# Patient Record
Sex: Female | Born: 1965
Health system: Southern US, Community
[De-identification: ages and names within clinical notes are randomized; demographics above are authoritative.]

## PROBLEM LIST (undated history)

## (undated) DIAGNOSIS — Z789 Other specified health status: Secondary | ICD-10-CM

## (undated) DIAGNOSIS — R519 Headache, unspecified: Secondary | ICD-10-CM

## (undated) DIAGNOSIS — J45909 Unspecified asthma, uncomplicated: Secondary | ICD-10-CM

## (undated) DIAGNOSIS — C4491 Basal cell carcinoma of skin, unspecified: Secondary | ICD-10-CM

## (undated) DIAGNOSIS — R51 Headache: Secondary | ICD-10-CM

## (undated) HISTORY — PX: CHOLECYSTECTOMY: SHX55

## (undated) HISTORY — PX: COLONOSCOPY: SHX174

## (undated) HISTORY — DX: Basal cell carcinoma of skin, unspecified: C44.91

---

## 2007-08-10 ENCOUNTER — Ambulatory Visit: Payer: Self-pay | Admitting: Cardiology

## 2008-06-27 DIAGNOSIS — C4491 Basal cell carcinoma of skin, unspecified: Secondary | ICD-10-CM

## 2008-06-27 HISTORY — DX: Basal cell carcinoma of skin, unspecified: C44.91

## 2012-10-30 ENCOUNTER — Encounter (INDEPENDENT_AMBULATORY_CARE_PROVIDER_SITE_OTHER): Payer: Self-pay | Admitting: *Deleted

## 2012-10-30 ENCOUNTER — Encounter (INDEPENDENT_AMBULATORY_CARE_PROVIDER_SITE_OTHER): Payer: Self-pay

## 2012-11-21 ENCOUNTER — Telehealth (INDEPENDENT_AMBULATORY_CARE_PROVIDER_SITE_OTHER): Payer: Self-pay | Admitting: *Deleted

## 2012-11-21 ENCOUNTER — Other Ambulatory Visit (INDEPENDENT_AMBULATORY_CARE_PROVIDER_SITE_OTHER): Payer: Self-pay | Admitting: *Deleted

## 2012-11-21 DIAGNOSIS — Z1211 Encounter for screening for malignant neoplasm of colon: Secondary | ICD-10-CM

## 2012-11-21 DIAGNOSIS — Z800 Family history of malignant neoplasm of digestive organs: Secondary | ICD-10-CM

## 2012-11-21 MED ORDER — PEG-KCL-NACL-NASULF-NA ASC-C 100 G PO SOLR: 1.0000 | Freq: Once | ORAL | Status: DC

## 2012-11-21 NOTE — Telephone Encounter (Signed)
Patient needs movi prep 

## 2012-12-26 ENCOUNTER — Telehealth (INDEPENDENT_AMBULATORY_CARE_PROVIDER_SITE_OTHER): Payer: Self-pay | Admitting: *Deleted

## 2012-12-26 NOTE — Telephone Encounter (Signed)
  Procedure: tcs  Reason/Indication:  fam hx colon ca  Has patient had this procedure before?  Yes, 2009 (scanned)  If so, when, by whom and where?    Is there a family history of colon cancer?  Yes, father  Who?  What age when diagnosed?    Is patient diabetic?   no      Does patient have prosthetic heart valve?  no  Do you have a pacemaker?  no  Has patient ever had endocarditis? no  Has patient had joint replacement within last 12 months?  no  Is patient on Coumadin, Plavix and/or Aspirin? no  Medications: lavora (BC)  Allergies: nkda  Medication Adjustment:   Procedure date & time: 01/24/13 at 730

## 2012-12-27 NOTE — Telephone Encounter (Signed)
agree

## 2013-01-09 ENCOUNTER — Encounter (HOSPITAL_COMMUNITY): Payer: Self-pay | Admitting: Pharmacy Technician

## 2013-01-21 ENCOUNTER — Encounter (INDEPENDENT_AMBULATORY_CARE_PROVIDER_SITE_OTHER): Payer: Self-pay | Admitting: *Deleted

## 2013-01-24 ENCOUNTER — Encounter (HOSPITAL_COMMUNITY)
Admission: RE | Disposition: A | Discharge status: Home / Self Care | Payer: Self-pay | Source: Ambulatory Visit | Attending: Internal Medicine

## 2013-01-24 ENCOUNTER — Ambulatory Visit (HOSPITAL_COMMUNITY)
Admission: RE | Admit: 2013-01-24 | Discharge: 2013-01-24 | Disposition: A | Discharge status: Home / Self Care | Payer: BC Managed Care – PPO | Source: Ambulatory Visit | Attending: Internal Medicine | Admitting: Internal Medicine

## 2013-01-24 ENCOUNTER — Encounter (HOSPITAL_COMMUNITY): Payer: Self-pay | Admitting: *Deleted

## 2013-01-24 DIAGNOSIS — K5730 Diverticulosis of large intestine without perforation or abscess without bleeding: Secondary | ICD-10-CM

## 2013-01-24 DIAGNOSIS — Z1211 Encounter for screening for malignant neoplasm of colon: Secondary | ICD-10-CM | POA: Insufficient documentation

## 2013-01-24 DIAGNOSIS — Z800 Family history of malignant neoplasm of digestive organs: Secondary | ICD-10-CM | POA: Insufficient documentation

## 2013-01-24 HISTORY — DX: Other specified health status: Z78.9

## 2013-01-24 HISTORY — PX: COLONOSCOPY: SHX5424

## 2013-01-24 SURGERY — COLONOSCOPY
Anesthesia: Moderate Sedation

## 2013-01-24 MED ORDER — SODIUM CHLORIDE 0.9 % IV SOLN
INTRAVENOUS | Status: DC
Start: 2013-01-24 — End: 2013-01-24
  Administered 2013-01-24: 07:00:00 via INTRAVENOUS

## 2013-01-24 MED ORDER — MIDAZOLAM HCL 5 MG/5ML IJ SOLN
INTRAMUSCULAR | Status: DC | PRN
  Administered 2013-01-24 (×5): 2 mg via INTRAVENOUS

## 2013-01-24 MED ORDER — MEPERIDINE HCL 50 MG/ML IJ SOLN
INTRAMUSCULAR | Status: DC | PRN
  Administered 2013-01-24 (×2): 25 mg via INTRAVENOUS

## 2013-01-24 MED ORDER — STERILE WATER FOR IRRIGATION IR SOLN
Status: DC | PRN
  Administered 2013-01-24: 08:00:00

## 2013-01-24 MED ORDER — MEPERIDINE HCL 50 MG/ML IJ SOLN
INTRAMUSCULAR | Status: AC
  Filled 2013-01-24: qty 1

## 2013-01-24 MED ORDER — MIDAZOLAM HCL 5 MG/5ML IJ SOLN
INTRAMUSCULAR | Status: AC
  Filled 2013-01-24: qty 10

## 2013-01-24 NOTE — H&P (Signed)
Anna Hanson is an 47 y.o. female.   Chief Complaint: Patient is here for colonoscopy. HPI: Patient is 47 year old Caucasian female who is for high-risk screening colonoscopy. She denies abdominal pain change in bowel habits or rectal bleeding. Patient's last colonoscopy was in July 2009. Family history is significant for colon carcinoma in her father age 36 and he died of metastatic disease 10 years later.  Past Medical History  Diagnosis Date  . Medical history non-contributory     Past Surgical History  Procedure Laterality Date  . Cholecystectomy    . Colonoscopy      Family History  Problem Relation Age of Onset  . Colon cancer Father    Social History:  reports that she has never smoked. She does not have any smokeless tobacco history on file. She reports that she does not drink alcohol or use illicit drugs.  Allergies:  Allergies  Allergen Reactions  . Other Anaphylaxis    Cashews and Pecans    Medications Prior to Admission  Medication Sig Dispense Refill  . levonorgestrel-ethinyl estradiol (LEVORA 0.15/30, 28,) 0.15-30 MG-MCG tablet Take 1 tablet by mouth daily.      . peg 3350 powder (MOVIPREP) 100 G SOLR Take 1 kit (100 g total) by mouth once.  1 kit  0    No results found for this or any previous visit (from the past 48 hour(s)). No results found.  ROS  Blood pressure 151/97, temperature 98.1 F (36.7 C), temperature source Oral, resp. rate 19, height 5\' 5"  (1.651 m), weight 165 lb (74.844 kg), last menstrual period 01/17/2013, SpO2 98.00%. Physical Exam  Constitutional: She appears well-developed and well-nourished.  HENT:  Mouth/Throat: Oropharynx is clear and moist.  Eyes: Conjunctivae are normal. No scleral icterus.  Neck: No thyromegaly present.  Cardiovascular: Normal rate, regular rhythm and normal heart sounds.   No murmur heard. Respiratory: Effort normal and breath sounds normal.  GI: Soft. She exhibits no distension and no mass. There is no  tenderness.  Musculoskeletal: She exhibits no edema.  Lymphadenopathy:    She has no cervical adenopathy.  Neurological: She is alert.  Skin: Skin is warm and dry.     Assessment/Plan High risk screening colonoscopy. Family history of colon carcinoma first degree relative(father, age 12 at time of diagnosis).  [name] U 01/24/2013, 7:34 AM

## 2013-01-24 NOTE — Op Note (Signed)
COLONOSCOPY PROCEDURE REPORT  PATIENT:  Anna Hanson  MR#:  [id number] Birthdate:  [birth year], 47 y.o., female Endoscopist:  Dr. Malissa Hippo, MD Referred By:  Dr. Donzetta Sprung, MD  Procedure Date: 01/24/2013  Procedure:   Colonoscopy  Indications:  Patient is 47 year old Caucasian female who is undergoing high risk screening colonoscopy. Patient's last exam was in July 2009. Family history significant for colon carcinoma in the father at age 29. He died of metastases 10 year later.  Informed Consent:  The procedure and risks were reviewed with the patient and informed consent was obtained.  Medications:  Demerol 50 mg IV Versed 10 mg IV  Description of procedure:  After a digital rectal exam was performed, that colonoscope was advanced from the anus through the rectum and colon to the area of the cecum, ileocecal valve and appendiceal orifice. The cecum was deeply intubated. These structures were well-seen and photographed for the record. From the level of the cecum and ileocecal valve, the scope was slowly and cautiously withdrawn. The mucosal surfaces were carefully surveyed utilizing scope tip to flexion to facilitate fold flattening as needed. The scope was pulled down into the rectum where a thorough exam including retroflexion was performed.  Findings:   Prep excellent. Scattered diverticula at the sigmoid and descending colon. Normal rectal mucosa and anal rectal junction.   Therapeutic/Diagnostic Maneuvers Performed:   None  Complications:  None  Cecal Withdrawal Time:  8 minutes  Impression:  Few scattered diverticula at sigmoid and descending colon otherwise normal colonoscopy.  Recommendations:  Standard instructions given. Next screening exam in 5 years.  Tesha Archambeau U  01/24/2013 8:14 AM  CC: Dr. Donzetta Sprung, MD & Dr. Bonnetta Barry ref. provider found

## 2013-01-25 ENCOUNTER — Encounter (HOSPITAL_COMMUNITY): Payer: Self-pay | Admitting: Internal Medicine

## 2016-01-04 DIAGNOSIS — M5441 Lumbago with sciatica, right side: Secondary | ICD-10-CM | POA: Diagnosis not present

## 2016-01-04 DIAGNOSIS — S338XXA Sprain of other parts of lumbar spine and pelvis, initial encounter: Secondary | ICD-10-CM | POA: Diagnosis not present

## 2016-01-07 DIAGNOSIS — M5441 Lumbago with sciatica, right side: Secondary | ICD-10-CM | POA: Diagnosis not present

## 2016-01-07 DIAGNOSIS — S338XXA Sprain of other parts of lumbar spine and pelvis, initial encounter: Secondary | ICD-10-CM | POA: Diagnosis not present

## 2016-01-12 DIAGNOSIS — S338XXA Sprain of other parts of lumbar spine and pelvis, initial encounter: Secondary | ICD-10-CM | POA: Diagnosis not present

## 2016-01-12 DIAGNOSIS — M5441 Lumbago with sciatica, right side: Secondary | ICD-10-CM | POA: Diagnosis not present

## 2016-01-15 DIAGNOSIS — S338XXA Sprain of other parts of lumbar spine and pelvis, initial encounter: Secondary | ICD-10-CM | POA: Diagnosis not present

## 2016-01-15 DIAGNOSIS — Z6833 Body mass index (BMI) 33.0-33.9, adult: Secondary | ICD-10-CM | POA: Diagnosis not present

## 2016-01-15 DIAGNOSIS — M545 Low back pain: Secondary | ICD-10-CM | POA: Diagnosis not present

## 2016-01-15 DIAGNOSIS — M5441 Lumbago with sciatica, right side: Secondary | ICD-10-CM | POA: Diagnosis not present

## 2016-01-15 DIAGNOSIS — Z9189 Other specified personal risk factors, not elsewhere classified: Secondary | ICD-10-CM | POA: Diagnosis not present

## 2016-01-15 DIAGNOSIS — Z1389 Encounter for screening for other disorder: Secondary | ICD-10-CM | POA: Diagnosis not present

## 2016-01-15 DIAGNOSIS — Z23 Encounter for immunization: Secondary | ICD-10-CM | POA: Diagnosis not present

## 2016-01-18 DIAGNOSIS — M5441 Lumbago with sciatica, right side: Secondary | ICD-10-CM | POA: Diagnosis not present

## 2016-01-18 DIAGNOSIS — S338XXA Sprain of other parts of lumbar spine and pelvis, initial encounter: Secondary | ICD-10-CM | POA: Diagnosis not present

## 2016-01-22 DIAGNOSIS — M545 Low back pain: Secondary | ICD-10-CM | POA: Diagnosis not present

## 2016-01-28 DIAGNOSIS — M545 Low back pain: Secondary | ICD-10-CM | POA: Diagnosis not present

## 2016-01-28 DIAGNOSIS — M5127 Other intervertebral disc displacement, lumbosacral region: Secondary | ICD-10-CM | POA: Diagnosis not present

## 2016-02-02 DIAGNOSIS — M5126 Other intervertebral disc displacement, lumbar region: Secondary | ICD-10-CM | POA: Diagnosis not present

## 2016-02-03 ENCOUNTER — Other Ambulatory Visit: Payer: Self-pay | Admitting: Neurosurgery

## 2016-02-03 DIAGNOSIS — M5126 Other intervertebral disc displacement, lumbar region: Secondary | ICD-10-CM

## 2016-02-11 ENCOUNTER — Other Ambulatory Visit: Payer: Self-pay

## 2016-02-15 ENCOUNTER — Ambulatory Visit
Admission: RE | Admit: 2016-02-15 | Discharge: 2016-02-15 | Disposition: A | Discharge status: Home / Self Care | Payer: BLUE CROSS/BLUE SHIELD | Source: Ambulatory Visit | Attending: Neurosurgery | Admitting: Neurosurgery

## 2016-02-15 ENCOUNTER — Other Ambulatory Visit: Payer: Self-pay | Admitting: Neurosurgery

## 2016-02-15 DIAGNOSIS — M5126 Other intervertebral disc displacement, lumbar region: Secondary | ICD-10-CM

## 2016-02-15 DIAGNOSIS — M5127 Other intervertebral disc displacement, lumbosacral region: Secondary | ICD-10-CM | POA: Diagnosis not present

## 2016-02-15 IMAGING — XA DG EPIDURAL/NERVE ROOT
2 series · 2 of 2 positions shown · non-contrast
Comparison: none

CLINICAL DATA: RIGHT leg pain.  L5-S1 disc extrusion.

[Series 1: ortho standard · 1 of 1 slices shown (1 of 2)]
[im 1/1]
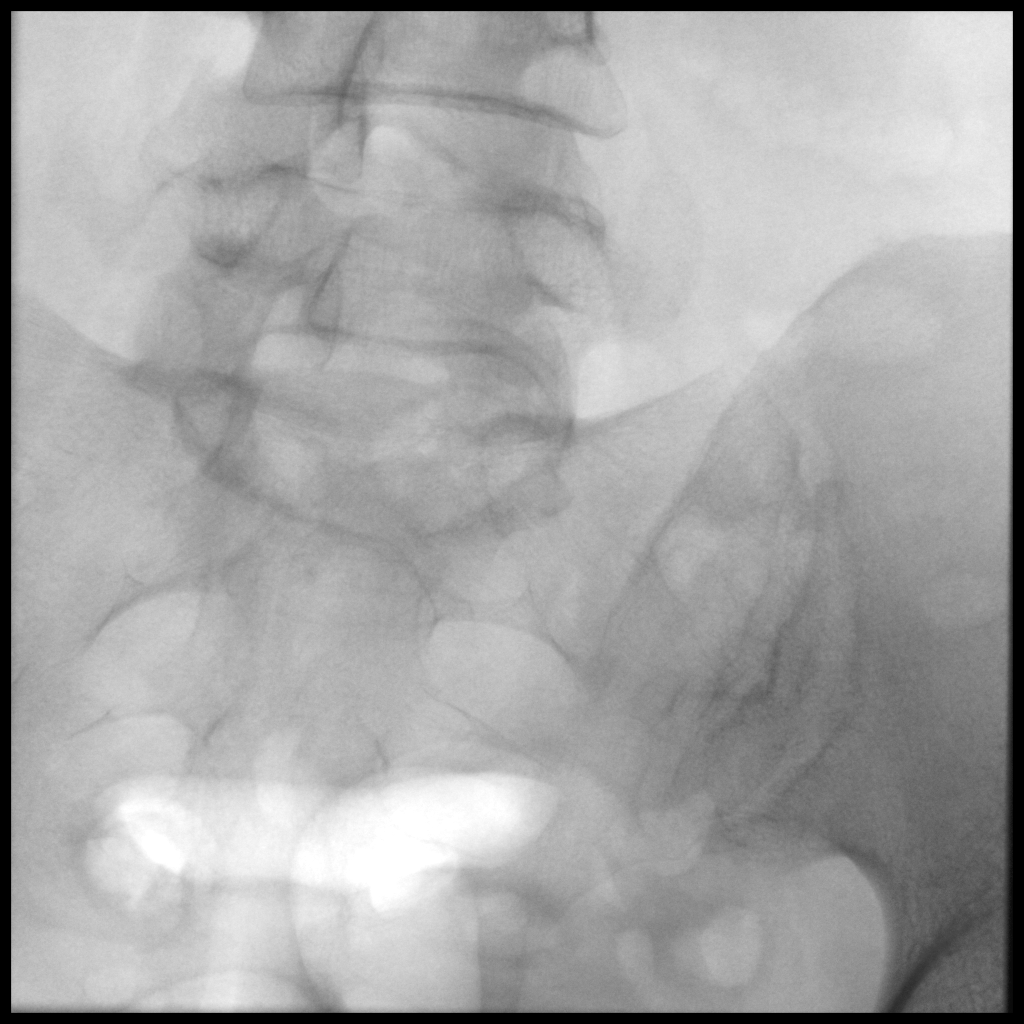

[Series 2: ortho standard · 1 of 1 slices shown (2 of 2)]
[im 1/1]
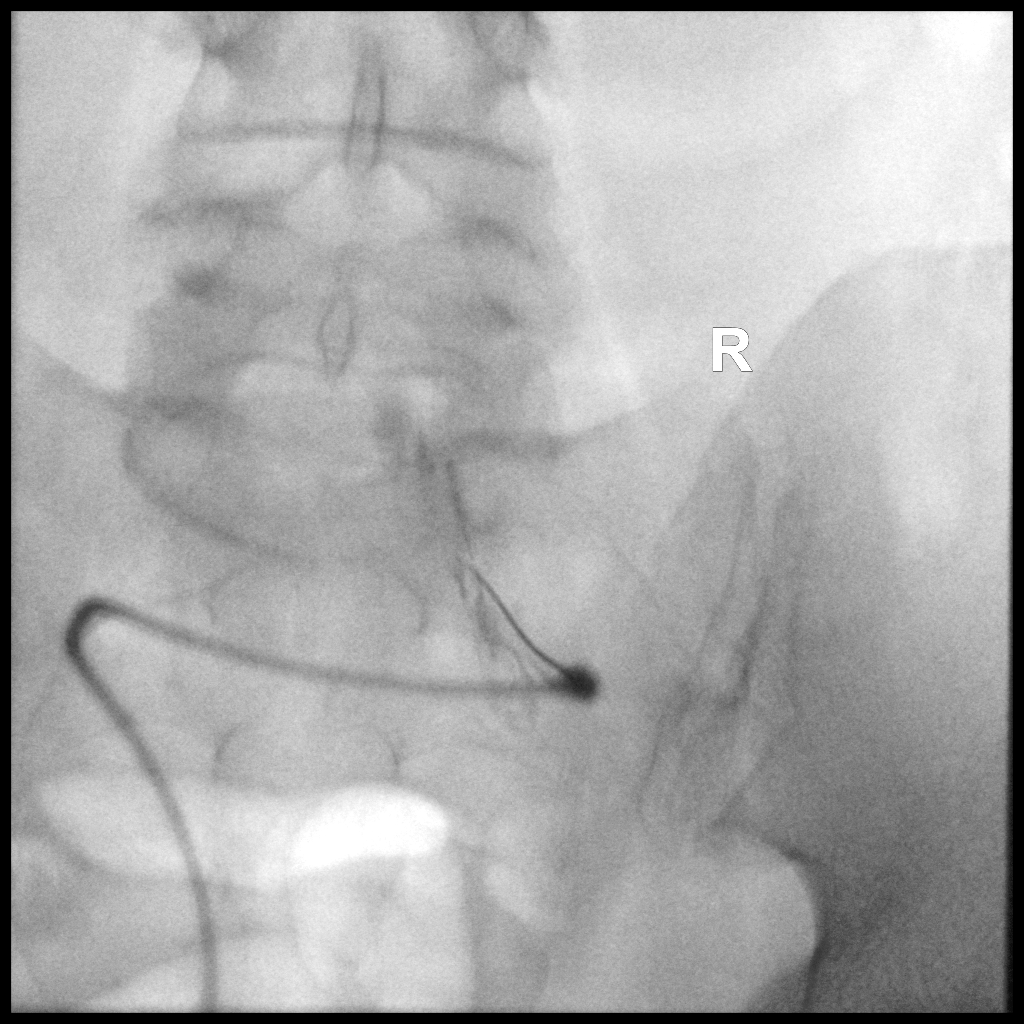

[2 of 2 positions shown; findings below may reference images not displayed]

EXAM:
EPIDURAL/NERVE ROOT

FLUOROSCOPY TIME:  15 seconds corresponding to a Dose Area Product
of 53.96 ?Gy*m2

PROCEDURE:
The procedure, risks, benefits, and alternatives were explained to
the patient. Questions regarding the procedure were encouraged and
answered. The patient understands and consents to the procedure.

RIGHT S1 NERVE ROOT BLOCK AND TRANSFORAMINAL EPIDURAL: A posterior
oblique approach was taken to the intervertebral foramen on the
RIGHT at S1 foramen using a curved 22 gauge spinal needle. Injection
of Isovue-M 200 outlined the RIGHT S1 nerve root and showed good
epidural spread. No vascular opacification is seen. 120.0 mg of
Depo-Medrol mixed with 2 mL 1% lidocaine were instilled. The
procedure was well-tolerated, and the patient was discharged thirty
minutes following the injection in good condition.

COMPLICATIONS:
None
IMPRESSION: Technically successful injection consisting of a RIGHT S1 nerve root
block and transforaminal epidural.

## 2016-02-15 MED ORDER — METHYLPREDNISOLONE ACETATE 40 MG/ML INJ SUSP (RADIOLOG
120.0000 mg | Freq: Once | INTRAMUSCULAR | Status: AC
  Administered 2016-02-15: 120 mg via EPIDURAL

## 2016-02-15 MED ORDER — IOPAMIDOL (ISOVUE-M 200) INJECTION 41%
1.0000 mL | Freq: Once | INTRAMUSCULAR | Status: AC
  Administered 2016-02-15: 1 mL via EPIDURAL

## 2016-02-15 NOTE — Discharge Instructions (Signed)

## 2016-03-03 DIAGNOSIS — M5126 Other intervertebral disc displacement, lumbar region: Secondary | ICD-10-CM | POA: Diagnosis not present

## 2016-03-07 DIAGNOSIS — I10 Essential (primary) hypertension: Secondary | ICD-10-CM | POA: Diagnosis not present

## 2016-03-07 DIAGNOSIS — Z6832 Body mass index (BMI) 32.0-32.9, adult: Secondary | ICD-10-CM | POA: Diagnosis not present

## 2016-05-18 DIAGNOSIS — L570 Actinic keratosis: Secondary | ICD-10-CM | POA: Diagnosis not present

## 2016-05-18 DIAGNOSIS — C44519 Basal cell carcinoma of skin of other part of trunk: Secondary | ICD-10-CM | POA: Diagnosis not present

## 2016-05-18 DIAGNOSIS — D229 Melanocytic nevi, unspecified: Secondary | ICD-10-CM | POA: Diagnosis not present

## 2016-05-18 DIAGNOSIS — L918 Other hypertrophic disorders of the skin: Secondary | ICD-10-CM | POA: Diagnosis not present

## 2016-05-20 DIAGNOSIS — K219 Gastro-esophageal reflux disease without esophagitis: Secondary | ICD-10-CM | POA: Diagnosis not present

## 2016-05-20 DIAGNOSIS — Z6833 Body mass index (BMI) 33.0-33.9, adult: Secondary | ICD-10-CM | POA: Diagnosis not present

## 2016-05-20 DIAGNOSIS — R140 Abdominal distension (gaseous): Secondary | ICD-10-CM | POA: Diagnosis not present

## 2016-05-20 DIAGNOSIS — R5383 Other fatigue: Secondary | ICD-10-CM | POA: Diagnosis not present

## 2016-05-27 DIAGNOSIS — Z1231 Encounter for screening mammogram for malignant neoplasm of breast: Secondary | ICD-10-CM | POA: Diagnosis not present

## 2016-06-10 DIAGNOSIS — Z6834 Body mass index (BMI) 34.0-34.9, adult: Secondary | ICD-10-CM | POA: Diagnosis not present

## 2016-06-10 DIAGNOSIS — Z1151 Encounter for screening for human papillomavirus (HPV): Secondary | ICD-10-CM | POA: Diagnosis not present

## 2016-06-10 DIAGNOSIS — Z01419 Encounter for gynecological examination (general) (routine) without abnormal findings: Secondary | ICD-10-CM | POA: Diagnosis not present

## 2016-06-24 DIAGNOSIS — C44519 Basal cell carcinoma of skin of other part of trunk: Secondary | ICD-10-CM | POA: Diagnosis not present

## 2017-05-19 DIAGNOSIS — M545 Low back pain: Secondary | ICD-10-CM | POA: Diagnosis not present

## 2017-05-19 DIAGNOSIS — I10 Essential (primary) hypertension: Secondary | ICD-10-CM | POA: Diagnosis not present

## 2017-05-19 DIAGNOSIS — K219 Gastro-esophageal reflux disease without esophagitis: Secondary | ICD-10-CM | POA: Diagnosis not present

## 2017-05-19 DIAGNOSIS — R5383 Other fatigue: Secondary | ICD-10-CM | POA: Diagnosis not present

## 2017-06-23 DIAGNOSIS — Z3041 Encounter for surveillance of contraceptive pills: Secondary | ICD-10-CM | POA: Diagnosis not present

## 2017-06-23 DIAGNOSIS — Z6833 Body mass index (BMI) 33.0-33.9, adult: Secondary | ICD-10-CM | POA: Diagnosis not present

## 2017-06-23 DIAGNOSIS — Z01419 Encounter for gynecological examination (general) (routine) without abnormal findings: Secondary | ICD-10-CM | POA: Diagnosis not present

## 2017-07-07 DIAGNOSIS — Z1231 Encounter for screening mammogram for malignant neoplasm of breast: Secondary | ICD-10-CM | POA: Diagnosis not present

## 2017-12-04 DIAGNOSIS — M79672 Pain in left foot: Secondary | ICD-10-CM | POA: Diagnosis not present

## 2017-12-04 DIAGNOSIS — M25579 Pain in unspecified ankle and joints of unspecified foot: Secondary | ICD-10-CM | POA: Diagnosis not present

## 2017-12-25 ENCOUNTER — Telehealth (INDEPENDENT_AMBULATORY_CARE_PROVIDER_SITE_OTHER): Payer: Self-pay | Admitting: Internal Medicine

## 2017-12-25 NOTE — Telephone Encounter (Signed)
Patient called stated she thought it was time for her colonoscopy - shows her last one was done in Sept. 2014 - please call her at [phone] (work) or [phone] (home)

## 2017-12-26 NOTE — Telephone Encounter (Signed)
Spoke to patient, advised she is on recall for Sept and she will be receiving her letter & questionnaire in next few weeks

## 2018-01-12 ENCOUNTER — Encounter (INDEPENDENT_AMBULATORY_CARE_PROVIDER_SITE_OTHER): Payer: Self-pay | Admitting: *Deleted

## 2018-02-08 ENCOUNTER — Other Ambulatory Visit (INDEPENDENT_AMBULATORY_CARE_PROVIDER_SITE_OTHER): Payer: Self-pay | Admitting: *Deleted

## 2018-02-08 DIAGNOSIS — Z800 Family history of malignant neoplasm of digestive organs: Secondary | ICD-10-CM | POA: Insufficient documentation

## 2018-02-16 DIAGNOSIS — D229 Melanocytic nevi, unspecified: Secondary | ICD-10-CM | POA: Diagnosis not present

## 2018-02-16 DIAGNOSIS — L821 Other seborrheic keratosis: Secondary | ICD-10-CM | POA: Diagnosis not present

## 2018-04-04 DIAGNOSIS — Z23 Encounter for immunization: Secondary | ICD-10-CM | POA: Diagnosis not present

## 2018-04-09 ENCOUNTER — Telehealth (INDEPENDENT_AMBULATORY_CARE_PROVIDER_SITE_OTHER): Payer: Self-pay | Admitting: *Deleted

## 2018-04-09 ENCOUNTER — Encounter (INDEPENDENT_AMBULATORY_CARE_PROVIDER_SITE_OTHER): Payer: Self-pay | Admitting: *Deleted

## 2018-04-09 MED ORDER — SUPREP BOWEL PREP KIT 17.5-3.13-1.6 GM/177ML PO SOLN: 1.0000 | Freq: Once | ORAL | 0 refills | Status: AC

## 2018-04-09 NOTE — Telephone Encounter (Signed)
Referring MD/PCP: daniel   Procedure: tcs  Reason/Indication:  fam hx colon ca  Has patient had this procedure before?  Yes, 2014  If so, when, by whom and where?    Is there a family history of colon cancer?  Yes, father  Who?  What age when diagnosed?    Is patient diabetic?   no      Does patient have prosthetic heart valve or mechanical valve?  no  Do you have a pacemaker?  no  Has patient ever had endocarditis? no  Has patient had joint replacement within last 12 months?  no  Is patient constipated or do they take laxatives? no  Does patient have a history of alcohol/drug use?  no  Is patient on blood thinner such as Coumadin, Plavix and/or Aspirin? no  Medications: omeprazole 20 mg daily, metoprolol 50 mg daily, diclofenac 75 mg bid, vit c 1000 mg, vit b12 5000 mcg, birth control pill  Allergies: nkda  Medication Adjustment per Dr Lindi Adie, NP:   Procedure date & time: 05/28/18 at 830

## 2018-04-09 NOTE — Telephone Encounter (Signed)
agree

## 2018-04-09 NOTE — Telephone Encounter (Signed)
Patient needs suprep 

## 2018-05-10 ENCOUNTER — Ambulatory Visit (HOSPITAL_COMMUNITY)
Admission: RE | Admit: 2018-05-10 | Discharge: 2018-05-10 | Disposition: A | Discharge status: Home / Self Care | Payer: BLUE CROSS/BLUE SHIELD | Attending: Internal Medicine | Admitting: Internal Medicine

## 2018-05-10 ENCOUNTER — Other Ambulatory Visit: Payer: Self-pay

## 2018-05-10 ENCOUNTER — Encounter (HOSPITAL_COMMUNITY)
Admission: RE | Disposition: A | Discharge status: Home / Self Care | Payer: Self-pay | Source: Home / Self Care | Attending: Internal Medicine

## 2018-05-10 ENCOUNTER — Encounter (HOSPITAL_COMMUNITY): Payer: Self-pay | Admitting: *Deleted

## 2018-05-10 DIAGNOSIS — K5730 Diverticulosis of large intestine without perforation or abscess without bleeding: Secondary | ICD-10-CM | POA: Insufficient documentation

## 2018-05-10 DIAGNOSIS — Z800 Family history of malignant neoplasm of digestive organs: Secondary | ICD-10-CM | POA: Insufficient documentation

## 2018-05-10 DIAGNOSIS — Z79899 Other long term (current) drug therapy: Secondary | ICD-10-CM | POA: Insufficient documentation

## 2018-05-10 DIAGNOSIS — Z793 Long term (current) use of hormonal contraceptives: Secondary | ICD-10-CM | POA: Diagnosis not present

## 2018-05-10 DIAGNOSIS — Z1211 Encounter for screening for malignant neoplasm of colon: Secondary | ICD-10-CM | POA: Diagnosis not present

## 2018-05-10 DIAGNOSIS — K648 Other hemorrhoids: Secondary | ICD-10-CM | POA: Insufficient documentation

## 2018-05-10 DIAGNOSIS — D123 Benign neoplasm of transverse colon: Secondary | ICD-10-CM | POA: Insufficient documentation

## 2018-05-10 HISTORY — DX: Unspecified asthma, uncomplicated: J45.909

## 2018-05-10 HISTORY — DX: Headache: R51

## 2018-05-10 HISTORY — DX: Headache, unspecified: R51.9

## 2018-05-10 HISTORY — PX: COLONOSCOPY: SHX5424

## 2018-05-10 HISTORY — PX: POLYPECTOMY: SHX5525

## 2018-05-10 SURGERY — COLONOSCOPY
Anesthesia: Moderate Sedation

## 2018-05-10 MED ORDER — MEPERIDINE HCL 50 MG/ML IJ SOLN
INTRAMUSCULAR | Status: AC
  Filled 2018-05-10: qty 1

## 2018-05-10 MED ORDER — SODIUM CHLORIDE 0.9 % IV SOLN
INTRAVENOUS | Status: DC
  Administered 2018-05-10: 1000 mL via INTRAVENOUS

## 2018-05-10 MED ORDER — MEPERIDINE HCL 50 MG/ML IJ SOLN
INTRAMUSCULAR | Status: DC | PRN
  Administered 2018-05-10 (×3): 25 mg

## 2018-05-10 MED ORDER — MIDAZOLAM HCL 5 MG/5ML IJ SOLN
INTRAMUSCULAR | Status: DC | PRN
  Administered 2018-05-10 (×4): 2 mg via INTRAVENOUS
  Administered 2018-05-10: 1 mg via INTRAVENOUS

## 2018-05-10 MED ORDER — MIDAZOLAM HCL 5 MG/5ML IJ SOLN
INTRAMUSCULAR | Status: AC
  Filled 2018-05-10: qty 10

## 2018-05-10 NOTE — H&P (Signed)
Anna Hanson is an 53 y.o. female.   Chief Complaint: Patient is here for colonoscopy. HPI: Patient is 53 year old Caucasian female who is here for screening colonoscopy.  She denies abdominal pain change in bowel habits or rectal bleeding.  Colonoscopy was normal in September 2014. Father was diagnosed with colon carcinoma at age 99 and died of metastatic disease 10 years later.   Past Medical History:  Diagnosis Date  . Asthma   . Headache   . Medical history non-contributory     Past Surgical History:  Procedure Laterality Date  . CHOLECYSTECTOMY    . COLONOSCOPY    . COLONOSCOPY N/A 01/24/2013   Procedure: COLONOSCOPY;  Surgeon: Rogene Houston, MD;  Location: [facility];  Service: Endoscopy;  Laterality: N/A;  730    Family History  Problem Relation Age of Onset  . Colon cancer Father    Social History:  reports that she has never smoked. She has never used smokeless tobacco. She reports that she does not drink alcohol or use drugs.  Allergies:  Allergies  Allergen Reactions  . Other Anaphylaxis    Cashews and Pecans    Medications Prior to Admission  Medication Sig Dispense Refill  . albuterol (PROVENTIL HFA;VENTOLIN HFA) 108 (90 Base) MCG/ACT inhaler Inhale 1-2 puffs into the lungs every 6 (six) hours as needed for wheezing or shortness of breath.    Marland Kitchen ibuprofen (ADVIL,MOTRIN) 200 MG tablet Take 800 mg by mouth every 8 (eight) hours as needed (pain/headaches.).    Marland Kitchen levonorgestrel-ethinyl estradiol (LEVORA 0.15/30, 28,) 0.15-30 MG-MCG tablet Take 1 tablet by mouth at bedtime.     . metoprolol succinate (TOPROL-XL) 50 MG 24 hr tablet Take 50 mg by mouth daily. Take with or immediately following a meal.    . omeprazole (PRILOSEC) 20 MG capsule Take 20 mg by mouth daily before breakfast.      No results found for this or any previous visit (from the past 48 hour(s)). No results found.  ROS  Blood pressure (!) 150/99, pulse 94, temperature 98.3 F (36.8 C),  temperature source Oral, resp. rate 20, height 5\' 5"  (1.651 m), weight 88.5 kg, last menstrual period 04/27/2018, SpO2 100 %. Physical Exam  Constitutional: She appears well-developed and well-nourished.  HENT:  Mouth/Throat: Oropharynx is clear and moist.  Eyes: Conjunctivae are normal. No scleral icterus.  Neck: No thyromegaly present.  Cardiovascular: Normal rate, regular rhythm and normal heart sounds.  No murmur heard. Respiratory: Effort normal and breath sounds normal.  GI: Soft. She exhibits no distension and no mass. There is no abdominal tenderness.  Musculoskeletal:        General: No edema.  Lymphadenopathy:    She has no cervical adenopathy.  Neurological: She is alert.  Skin: Skin is warm and dry.     Assessment/Plan Higher screening colonoscopy.  Hildred Laser, MD 05/10/2018, 8:10 AM

## 2018-05-10 NOTE — Discharge Instructions (Signed)
No aspirin or NSAIDs for 3 days. Resume usual medications as before. Resume usual diet. No driving for 24 hours. Physician will call with biopsy results.  PATIENT INSTRUCTIONS POST-ANESTHESIA  IMMEDIATELY FOLLOWING SURGERY:  Do not drive or operate machinery for the first twenty four hours after surgery.  Do not make any important decisions for twenty four hours after surgery or while taking narcotic pain medications or sedatives.  If you develop intractable nausea and vomiting or a severe headache please notify your doctor immediately.  FOLLOW-UP:  Please make an appointment with your surgeon as instructed. You do not need to follow up with anesthesia unless specifically instructed to do so.  WOUND CARE INSTRUCTIONS (if applicable):  Keep a dry clean dressing on the anesthesia/puncture wound site if there is drainage.  Once the wound has quit draining you may leave it open to air.  Generally you should leave the bandage intact for twenty four hours unless there is drainage.  If the epidural site drains for more than 36-48 hours please call the anesthesia department.  QUESTIONS?:  Please feel free to call your physician or the hospital operator if you have any questions, and they will be happy to assist you.      Colonoscopy, Adult, Care After This sheet gives you information about how to care for yourself after your procedure. Your doctor may also give you more specific instructions. If you have problems or questions, call your doctor. What can I expect after the procedure? After the procedure, it is common to have:  A small amount of blood in your poop for 24 hours.  Some gas.  Mild cramping or bloating in your belly. Follow these instructions at home: General instructions  For the first 24 hours after the procedure: ? Do not drive or use machinery. ? Do not sign important documents. ? Do not drink alcohol. ? Do your daily activities more slowly than normal. ? Eat foods that are  soft and easy to digest.  Take over-the-counter or prescription medicines only as told by your doctor. To help cramping and bloating:   Try walking around.  Put heat on your belly (abdomen) as told by your doctor. Use a heat source that your doctor recommends, such as a moist heat pack or a heating pad. ? Put a towel between your skin and the heat source. ? Leave the heat on for 20-30 minutes. ? Remove the heat if your skin turns bright red. This is especially important if you cannot feel pain, heat, or cold. You can get burned. Eating and drinking   Drink enough fluid to keep your pee (urine) clear or pale yellow.  Return to your normal diet as told by your doctor. Avoid heavy or fried foods that are hard to digest.  Avoid drinking alcohol for as long as told by your doctor. Contact a doctor if:  You have blood in your poop (stool) 2-3 days after the procedure. Get help right away if:  You have more than a small amount of blood in your poop.  You see large clumps of tissue (blood clots) in your poop.  Your belly is swollen.  You feel sick to your stomach (nauseous).  You throw up (vomit).  You have a fever.  You have belly pain that gets worse, and medicine does not help your pain. Summary  After the procedure, it is common to have a small amount of blood in your poop. You may also have mild cramping and bloating in  your belly.  For the first 24 hours after the procedure, do not drive or use machinery, do not sign important documents, and do not drink alcohol.  Get help right away if you have a lot of blood in your poop, feel sick to your stomach, have a fever, or have more belly pain. This information is not intended to replace advice given to you by your health care provider. Make sure you discuss any questions you have with your health care provider. Document Released: 05/28/2010 Document Revised: 02/23/2017 Document Reviewed: 01/18/2016 Elsevier Interactive Patient  Education  2019 [employer].   Colon Polyps  Polyps are tissue growths inside the body. Polyps can grow in many places, including the large intestine (colon). A polyp may be a round bump or a mushroom-shaped growth. You could have one polyp or several. Most colon polyps are noncancerous (benign). However, some colon polyps can become cancerous over time. Finding and removing the polyps early can help prevent this. What are the causes? The exact cause of colon polyps is not known. What increases the risk? You are more likely to develop this condition if you:  Have a family history of colon cancer or colon polyps.  Are older than 25 or older than 45 if you are African American.  Have inflammatory bowel disease, such as ulcerative colitis or Crohn's disease.  Have certain hereditary conditions, such as: ? Familial adenomatous polyposis. ? Lynch syndrome. ? Turcot syndrome. ? Peutz-Jeghers syndrome.  Are overweight.  Smoke cigarettes.  Do not get enough exercise.  Drink too much alcohol.  Eat a diet that is high in fat and red meat and low in fiber.  Had childhood cancer that was treated with abdominal radiation. What are the signs or symptoms? Most polyps do not cause symptoms. If you have symptoms, they may include:  Blood coming from your rectum when having a bowel movement.  Blood in your stool. The stool may look dark red or black.  Abdominal pain.  A change in bowel habits, such as constipation or diarrhea. How is this diagnosed? This condition is diagnosed with a colonoscopy. This is a procedure in which a lighted, flexible scope is inserted into the anus and then passed into the colon to examine the area. Polyps are sometimes found when a colonoscopy is done as part of routine cancer screening tests. How is this treated? Treatment for this condition involves removing any polyps that are found. Most polyps can be removed during a colonoscopy. Those polyps will  then be tested for cancer. Additional treatment may be needed depending on the results of testing. Follow these instructions at home: Lifestyle  Maintain a healthy weight, or lose weight if recommended by your health care provider.  Exercise every day or as told by your health care provider.  Do not use any products that contain nicotine or tobacco, such as cigarettes and e-cigarettes. If you need help quitting, ask your health care provider.  If you drink alcohol, limit how much you have: ? 0-1 drink a day for women. ? 0-2 drinks a day for men.  Be aware of how much alcohol is in your drink. In the [place] one drink equals one 12 oz bottle of beer (355 mL), one 5 oz glass of wine (148 mL), or one 1 oz shot of hard liquor (44 mL). Eating and drinking   Eat foods that are high in fiber, such as fruits, vegetables, and whole grains.  Eat foods that are high in calcium  and vitamin D, such as milk, cheese, yogurt, eggs, liver, fish, and broccoli.  Limit foods that are high in fat, such as fried foods and desserts.  Limit the amount of red meat and processed meat you eat, such as hot dogs, sausage, bacon, and lunch meats. General instructions  Keep all follow-up visits as told by your health care provider. This is important. ? This includes having regularly scheduled colonoscopies. ? Talk to your health care provider about when you need a colonoscopy. Contact a health care provider if:  You have new or worsening bleeding during a bowel movement.  You have new or increased blood in your stool.  You have a change in bowel habits.  You lose weight for no known reason. Summary  Polyps are tissue growths inside the body. Polyps can grow in many places, including the colon.  Most colon polyps are noncancerous (benign), but some can become cancerous over time.  This condition is diagnosed with a colonoscopy.  Treatment for this condition involves removing any polyps that are found.  Most polyps can be removed during a colonoscopy. This information is not intended to replace advice given to you by your health care provider. Make sure you discuss any questions you have with your health care provider. Document Released: 01/20/2004 Document Revised: 08/10/2017 Document Reviewed: 08/10/2017 Elsevier Interactive Patient Education  2019 [employer].    Diverticulosis  Diverticulosis is a condition that develops when small pouches (diverticula) form in the wall of the large intestine (colon). The colon is where water is absorbed and stool is formed. The pouches form when the inside layer of the colon pushes through weak spots in the outer layers of the colon. You may have a few pouches or many of them. What are the causes? The cause of this condition is not known. What increases the risk? The following factors may make you more likely to develop this condition:  Being older than age 61. Your risk for this condition increases with age. Diverticulosis is rare among people younger than age 72. By age 26, many people have it.  Eating a low-fiber diet.  Having frequent constipation.  Being overweight.  Not getting enough exercise.  Smoking.  Taking over-the-counter pain medicines, like aspirin and ibuprofen.  Having a family history of diverticulosis. What are the signs or symptoms? In most people, there are no symptoms of this condition. If you do have symptoms, they may include:  Bloating.  Cramps in the abdomen.  Constipation or diarrhea.  Pain in the lower left side of the abdomen. How is this diagnosed? This condition is most often diagnosed during an exam for other colon problems. Because diverticulosis usually has no symptoms, it often cannot be diagnosed independently. This condition may be diagnosed by:  Using a flexible scope to examine the colon (colonoscopy).  Taking an X-ray of the colon after dye has been put into the colon (barium  enema).  Doing a CT scan. How is this treated? You may not need treatment for this condition if you have never developed an infection related to diverticulosis. If you have had an infection before, treatment may include:  Eating a high-fiber diet. This may include eating more fruits, vegetables, and grains.  Taking a fiber supplement.  Taking a live bacteria supplement (probiotic).  Taking medicine to relax your colon.  Taking antibiotic medicines. Follow these instructions at home:  Drink 6-8 glasses of water or more each day to prevent constipation.  Try not to strain when  you have a bowel movement.  If you have had an infection before: ? Eat more fiber as directed by your health care provider or your diet and nutrition specialist (dietitian). ? Take a fiber supplement or probiotic, if your health care provider approves.  Take over-the-counter and prescription medicines only as told by your health care provider.  If you were prescribed an antibiotic, take it as told by your health care provider. Do not stop taking the antibiotic even if you start to feel better.  Keep all follow-up visits as told by your health care provider. This is important. Contact a health care provider if:  You have pain in your abdomen.  You have bloating.  You have cramps.  You have not had a bowel movement in 3 days. Get help right away if:  Your pain gets worse.  Your bloating becomes very bad.  You have a fever or chills, and your symptoms suddenly get worse.  You vomit.  You have bowel movements that are bloody or black.  You have bleeding from your rectum. Summary  Diverticulosis is a condition that develops when small pouches (diverticula) form in the wall of the large intestine (colon).  You may have a few pouches or many of them.  This condition is most often diagnosed during an exam for other colon problems.  If you have had an infection related to diverticulosis,  treatment may include increasing the fiber in your diet, taking supplements, or taking medicines. This information is not intended to replace advice given to you by your health care provider. Make sure you discuss any questions you have with your health care provider. Document Released: 01/21/2004 Document Revised: 03/14/2016 Document Reviewed: 03/14/2016 Elsevier Interactive Patient Education  2019 [employer]   Hemorrhoids Hemorrhoids are swollen veins that may develop:  In the butt (rectum). These are called internal hemorrhoids.  Around the opening of the butt (anus). These are called external hemorrhoids. Hemorrhoids can cause pain, itching, or bleeding. Most of the time, they do not cause serious problems. They usually get better with diet changes, lifestyle changes, and other home treatments. What are the causes? This condition may be caused by:  Having trouble pooping (constipation).  Pushing hard (straining) to poop.  Watery poop (diarrhea).  Pregnancy.  Being very overweight (obese).  Sitting for long periods of time.  Heavy lifting or other activity that causes you to strain.  Anal sex.  Riding a bike for a long period of time. What are the signs or symptoms? Symptoms of this condition include:  Pain.  Itching or soreness in the butt.  Bleeding from the butt.  Leaking poop.  Swelling in the area.  One or more lumps around the opening of your butt. How is this diagnosed? A doctor can often diagnose this condition by looking at the affected area. The doctor may also:  Do an exam that involves feeling the area with a gloved hand (digital rectal exam).  Examine the area inside your butt using a small tube (anoscope).  Order blood tests. This may be done if you have lost a lot of blood.  Have you get a test that involves looking inside the colon using a flexible tube with a camera on the end (sigmoidoscopy or colonoscopy). How is this treated? This  condition can usually be treated at home. Your doctor may tell you to change what you eat, make lifestyle changes, or try home treatments. If these do not help, procedures can be done to  remove the hemorrhoids or make them smaller. These may involve:  Placing rubber bands at the base of the hemorrhoids to cut off their blood supply.  Injecting medicine into the hemorrhoids to shrink them.  Shining a type of light energy onto the hemorrhoids to cause them to fall off.  Doing surgery to remove the hemorrhoids or cut off their blood supply. Follow these instructions at home: Eating and drinking   Eat foods that have a lot of fiber in them. These include whole grains, beans, nuts, fruits, and vegetables.  Ask your doctor about taking products that have added fiber (fibersupplements).  Reduce the amount of fat in your diet. You can do this by: ? Eating low-fat dairy products. ? Eating less red meat. ? Avoiding processed foods.  Drink enough fluid to keep your pee (urine) pale yellow. Managing pain and swelling   Take a warm-water bath (sitz bath) for 20 minutes to ease pain. Do this 3-4 times a day. You may do this in a bathtub or using a portable sitz bath that fits over the toilet.  If told, put ice on the painful area. It may be helpful to use ice between your warm baths. ? Put ice in a plastic bag. ? Place a towel between your skin and the bag. ? Leave the ice on for 20 minutes, 2-3 times a day. General instructions  Take over-the-counter and prescription medicines only as told by your doctor. ? Medicated creams and medicines may be used as told.  Exercise often. Ask your doctor how much and what kind of exercise is best for you.  Go to the bathroom when you have the urge to poop. Do not wait.  Avoid pushing too hard when you poop.  Keep your butt dry and clean. Use wet toilet paper or moist towelettes after pooping.  Do not sit on the toilet for a long time.  Keep all  follow-up visits as told by your doctor. This is important. Contact a doctor if you:  Have pain and swelling that do not get better with treatment or medicine.  Have trouble pooping.  Cannot poop.  Have pain or swelling outside the area of the hemorrhoids. Get help right away if you have:  Bleeding that will not stop. Summary  Hemorrhoids are swollen veins in the butt or around the opening of the butt.  They can cause pain, itching, or bleeding.  Eat foods that have a lot of fiber in them. These include whole grains, beans, nuts, fruits, and vegetables.  Take a warm-water bath (sitz bath) for 20 minutes to ease pain. Do this 3-4 times a day. This information is not intended to replace advice given to you by your health care provider. Make sure you discuss any questions you have with your health care provider. Document Released: 02/02/2008 Document Revised: 09/14/2017 Document Reviewed: 09/14/2017 Elsevier Interactive Patient Education  2019 [employer].

## 2018-05-10 NOTE — Op Note (Signed)
[facility] Patient Name: Anna Hanson Procedure Date: 05/10/2018 8:05 AM MRN: [id number] Date of Birth: 11-10-1965 Attending MD: Hildred Laser , MD CSN: [id number] Age: 53 Admit Type: Outpatient Procedure:                Colonoscopy Indications:              Screening in patient at increased risk: Colorectal                            cancer in father before age 30 Providers:                Hildred Laser, MD, Gerome Sam, RN, Aram Candela Referring MD:             Gar Ponto, MD Medicines:                Meperidine 75 mg IV, Midazolam 9 mg IV Complications:            No immediate complications. Estimated Blood Loss:     Estimated blood loss was minimal. Procedure:                Pre-Anesthesia Assessment:                           - Prior to the procedure, a History and Physical                            was performed, and patient medications and                            allergies were reviewed. The patient's tolerance of                            previous anesthesia was also reviewed. The risks                            and benefits of the procedure and the sedation                            options and risks were discussed with the patient.                            All questions were answered, and informed consent                            was obtained. Prior Anticoagulants: The patient                            last took ibuprofen 7 days prior to the procedure.                            ASA Grade Assessment: I - A normal, healthy                            patient. After reviewing the risks and benefits,  the patient was deemed in satisfactory condition to                            undergo the procedure.                           After obtaining informed consent, the colonoscope                            was passed under direct vision. Throughout the                            procedure, the patient's blood pressure, pulse, and                             oxygen saturations were monitored continuously. The                            [id number]-[id number] ([id number]) scope was introduced through                            the anus and advanced to the the cecum, identified                            by appendiceal orifice and ileocecal valve. The                            colonoscopy was performed without difficulty. The                            patient tolerated the procedure well. The quality                            of the bowel preparation was excellent. The                            ileocecal valve, appendiceal orifice, and rectum                            were photographed. Scope In: 8:26:02 AM Scope Out: 8:53:32 AM Scope Withdrawal Time: 0 hours 21 minutes 46 seconds  Total Procedure Duration: 0 hours 27 minutes 30 seconds  Findings:      The perianal and digital rectal examinations were normal.      A 8 mm polyp was found in the proximal transverse colon. The polyp was       sessile. The polyp was removed with a piecemeal technique using a cold       snare. Resection and retrieval were complete. The pathology specimen was       placed into Bottle Number 1. To stop active bleeding, two hemostatic       clips were successfully placed (MR conditional). There was no bleeding       at the end of the procedure.      A few small-mouthed diverticula were found in the sigmoid colon.      Internal hemorrhoids  were found during retroflexion. The hemorrhoids       were small. Impression:               - One 8 mm polyp in the proximal transverse colon,                            removed piecemeal using a cold snare. Resected and                            retrieved. Clips (MR conditional) were placed.                           - Diverticulosis in the sigmoid colon.                           - Internal hemorrhoids. Moderate Sedation:      Moderate (conscious) sedation was administered by the endoscopy nurse       and  supervised by the endoscopist. The following parameters were       monitored: oxygen saturation, heart rate, blood pressure, CO2       capnography and response to care. Total physician intraservice time was       38 minutes. Recommendation:           - Patient has a contact number available for                            emergencies. The signs and symptoms of potential                            delayed complications were discussed with the                            patient. Return to normal activities tomorrow.                            Written discharge instructions were provided to the                            patient.                           - High fiber diet today.                           - Continue present medications.                           - No aspirin, ibuprofen, naproxen, or other                            non-steroidal anti-inflammatory drugs for 3 days                            after polyp removal.                           -  Await pathology results.                           - Repeat colonoscopy in 5 years. Procedure Code(s):        --- Professional ---                           [phone], Colonoscopy, flexible; with removal of                            tumor(s), polyp(s), or other lesion(s) by snare                            technique                           99153, Moderate sedation; each additional 15                            minutes intraservice time                           99153, Moderate sedation; each additional 15                            minutes intraservice time                           G0500, Moderate sedation services provided by the                            same physician or other qualified health care                            professional performing a gastrointestinal                            endoscopic service that sedation supports,                            requiring the presence of an independent trained                             observer to assist in the monitoring of the                            patient's level of consciousness and physiological                            status; initial 15 minutes of intra-service time;                            patient age 45 years or older (additional time may                            be reported with [phone], as  appropriate) Diagnosis Code(s):        --- Professional ---                           Z80.0, Family history of malignant neoplasm of                            digestive organs                           D12.3, Benign neoplasm of transverse colon (hepatic                            flexure or splenic flexure)                           K64.8, Other hemorrhoids                           K57.30, Diverticulosis of large intestine without                            perforation or abscess without bleeding CPT copyright 2018 [employer]. All rights reserved. The codes documented in this report are preliminary and upon coder review may  be revised to meet current compliance requirements. Hildred Laser, MD Hildred Laser, MD 05/10/2018 9:05:24 AM This report has been signed electronically. Number of Addenda: 0

## 2018-05-16 ENCOUNTER — Encounter (HOSPITAL_COMMUNITY): Payer: Self-pay | Admitting: Internal Medicine

## 2018-06-07 DIAGNOSIS — Z6833 Body mass index (BMI) 33.0-33.9, adult: Secondary | ICD-10-CM | POA: Diagnosis not present

## 2018-06-07 DIAGNOSIS — B349 Viral infection, unspecified: Secondary | ICD-10-CM | POA: Diagnosis not present

## 2018-06-22 ENCOUNTER — Other Ambulatory Visit: Payer: Self-pay | Admitting: Physician Assistant

## 2018-06-22 DIAGNOSIS — D485 Neoplasm of uncertain behavior of skin: Secondary | ICD-10-CM | POA: Diagnosis not present

## 2018-07-06 DIAGNOSIS — Z6833 Body mass index (BMI) 33.0-33.9, adult: Secondary | ICD-10-CM | POA: Diagnosis not present

## 2018-07-06 DIAGNOSIS — Z01419 Encounter for gynecological examination (general) (routine) without abnormal findings: Secondary | ICD-10-CM | POA: Diagnosis not present

## 2018-07-06 DIAGNOSIS — Z3041 Encounter for surveillance of contraceptive pills: Secondary | ICD-10-CM | POA: Diagnosis not present

## 2018-09-10 DIAGNOSIS — Z0000 Encounter for general adult medical examination without abnormal findings: Secondary | ICD-10-CM | POA: Diagnosis not present

## 2018-09-14 DIAGNOSIS — K219 Gastro-esophageal reflux disease without esophagitis: Secondary | ICD-10-CM | POA: Diagnosis not present

## 2018-09-14 DIAGNOSIS — I10 Essential (primary) hypertension: Secondary | ICD-10-CM | POA: Diagnosis not present

## 2018-09-14 DIAGNOSIS — M545 Low back pain: Secondary | ICD-10-CM | POA: Diagnosis not present

## 2018-09-14 DIAGNOSIS — Z0001 Encounter for general adult medical examination with abnormal findings: Secondary | ICD-10-CM | POA: Diagnosis not present

## 2018-09-14 DIAGNOSIS — R5383 Other fatigue: Secondary | ICD-10-CM | POA: Diagnosis not present

## 2019-01-04 DIAGNOSIS — S46211A Strain of muscle, fascia and tendon of other parts of biceps, right arm, initial encounter: Secondary | ICD-10-CM | POA: Diagnosis not present

## 2019-01-04 DIAGNOSIS — Z6834 Body mass index (BMI) 34.0-34.9, adult: Secondary | ICD-10-CM | POA: Diagnosis not present

## 2019-02-01 DIAGNOSIS — M25511 Pain in right shoulder: Secondary | ICD-10-CM | POA: Diagnosis not present

## 2019-02-01 DIAGNOSIS — S46211D Strain of muscle, fascia and tendon of other parts of biceps, right arm, subsequent encounter: Secondary | ICD-10-CM | POA: Diagnosis not present

## 2019-02-06 DIAGNOSIS — M25511 Pain in right shoulder: Secondary | ICD-10-CM | POA: Diagnosis not present

## 2019-02-06 DIAGNOSIS — S46211D Strain of muscle, fascia and tendon of other parts of biceps, right arm, subsequent encounter: Secondary | ICD-10-CM | POA: Diagnosis not present

## 2019-02-08 DIAGNOSIS — S46211D Strain of muscle, fascia and tendon of other parts of biceps, right arm, subsequent encounter: Secondary | ICD-10-CM | POA: Diagnosis not present

## 2019-02-08 DIAGNOSIS — M25511 Pain in right shoulder: Secondary | ICD-10-CM | POA: Diagnosis not present

## 2019-02-11 DIAGNOSIS — S46211D Strain of muscle, fascia and tendon of other parts of biceps, right arm, subsequent encounter: Secondary | ICD-10-CM | POA: Diagnosis not present

## 2019-02-11 DIAGNOSIS — M25511 Pain in right shoulder: Secondary | ICD-10-CM | POA: Diagnosis not present

## 2019-02-15 DIAGNOSIS — M25511 Pain in right shoulder: Secondary | ICD-10-CM | POA: Diagnosis not present

## 2019-02-15 DIAGNOSIS — Z6834 Body mass index (BMI) 34.0-34.9, adult: Secondary | ICD-10-CM | POA: Diagnosis not present

## 2019-02-15 DIAGNOSIS — S46211D Strain of muscle, fascia and tendon of other parts of biceps, right arm, subsequent encounter: Secondary | ICD-10-CM | POA: Diagnosis not present

## 2019-03-25 DIAGNOSIS — Z23 Encounter for immunization: Secondary | ICD-10-CM | POA: Diagnosis not present

## 2019-05-08 DIAGNOSIS — R05 Cough: Secondary | ICD-10-CM | POA: Diagnosis not present

## 2019-05-08 DIAGNOSIS — Z6833 Body mass index (BMI) 33.0-33.9, adult: Secondary | ICD-10-CM | POA: Diagnosis not present

## 2019-05-08 DIAGNOSIS — Z20828 Contact with and (suspected) exposure to other viral communicable diseases: Secondary | ICD-10-CM | POA: Diagnosis not present

## 2019-05-24 DIAGNOSIS — Z1231 Encounter for screening mammogram for malignant neoplasm of breast: Secondary | ICD-10-CM | POA: Diagnosis not present

## 2019-07-19 DIAGNOSIS — Z6833 Body mass index (BMI) 33.0-33.9, adult: Secondary | ICD-10-CM | POA: Diagnosis not present

## 2019-07-19 DIAGNOSIS — Z01419 Encounter for gynecological examination (general) (routine) without abnormal findings: Secondary | ICD-10-CM | POA: Diagnosis not present

## 2019-07-19 DIAGNOSIS — Z3041 Encounter for surveillance of contraceptive pills: Secondary | ICD-10-CM | POA: Diagnosis not present

## 2019-07-19 DIAGNOSIS — Z1151 Encounter for screening for human papillomavirus (HPV): Secondary | ICD-10-CM | POA: Diagnosis not present

## 2019-08-02 ENCOUNTER — Other Ambulatory Visit: Payer: Self-pay

## 2019-08-02 ENCOUNTER — Encounter: Payer: Self-pay | Admitting: Allergy & Immunology

## 2019-08-02 ENCOUNTER — Ambulatory Visit: Payer: BC Managed Care – PPO | Admitting: Allergy & Immunology

## 2019-08-02 VITALS — BP 134/78 | HR 86 | Temp 97.7°F | Resp 22 | Ht 64.5 in | Wt 197.6 lb

## 2019-08-02 DIAGNOSIS — J302 Other seasonal allergic rhinitis: Secondary | ICD-10-CM

## 2019-08-02 DIAGNOSIS — T7800XD Anaphylactic reaction due to unspecified food, subsequent encounter: Secondary | ICD-10-CM | POA: Diagnosis not present

## 2019-08-02 DIAGNOSIS — J3089 Other allergic rhinitis: Secondary | ICD-10-CM

## 2019-08-02 DIAGNOSIS — J4530 Mild persistent asthma, uncomplicated: Secondary | ICD-10-CM

## 2019-08-02 DIAGNOSIS — T7800XA Anaphylactic reaction due to unspecified food, initial encounter: Secondary | ICD-10-CM | POA: Insufficient documentation

## 2019-08-02 MED ORDER — BUDESONIDE-FORMOTEROL FUMARATE 80-4.5 MCG/ACT IN AERO: 2.0000 | INHALATION_SPRAY | Freq: Two times a day (BID) | RESPIRATORY_TRACT | 3 refills | Status: DC

## 2019-08-02 MED ORDER — MONTELUKAST SODIUM 10 MG PO TABS: 10.0000 mg | ORAL_TABLET | Freq: Every day | ORAL | 3 refills | Status: DC

## 2019-08-02 MED ORDER — FLUTICASONE PROPIONATE 50 MCG/ACT NA SUSP: 1.0000 | Freq: Every day | NASAL | 5 refills | Status: DC

## 2019-08-02 MED ORDER — LEVOCETIRIZINE DIHYDROCHLORIDE 5 MG PO TABS: 5.0000 mg | ORAL_TABLET | Freq: Every evening | ORAL | 3 refills | Status: DC

## 2019-08-02 NOTE — Progress Notes (Signed)
NEW PATIENT  Date of Service/Encounter:  08/02/19  Referring provider: Caryl Bis, MD   Assessment:   Mild persistent asthma, uncomplicated  Seasonal and perennial allergic rhinitis (grasses, indoor molds, dust mites, cat and dog)  Anaphylactic shock due to food (tree nuts)  Plan/Recommendations:   1. Chronic rhinitis - Testing today showed: grasses, indoor molds, dust mites, cat and dog - Copy of test results provided.  - Avoidance measures provided. - Stop taking: Chlortab - Start taking: Xyzal (levocetirizine) 5mg  tablet once daily, Singulair (montelukast) 10mg  daily and Flonase (fluticasone) one spray per nostril daily - You can use an extra dose of the antihistamine, if needed, for breakthrough symptoms.  - Consider nasal saline rinses 1-2 times daily to remove allergens from the nasal cavities as well as help with mucous clearance (this is especially helpful to do before the nasal sprays are given) - Consider allergy shots as a means of long-term control. - Allergy shots "re-train" and "reset" the immune system to ignore environmental allergens and decrease the resulting immune response to those allergens (sneezing, itchy watery eyes, runny nose, nasal congestion, etc).    - Allergy shots improve symptoms in 75-85% of patients.  - We can discuss more at the next appointment if the medications are not working for you.  2. Mild persistent asthma, uncomplicated - Lung testing was technically normal, but you did improve ever so slightly with the albuterol treatment. - This points towards a diagnosis of asthma, especially in combination with your clinical improvement with the albuterol.  - I do think that you need a daily controller medication for your breathing. - I am going to start Symbicort, which contains a long acting albuterol combined with an inhaled  - Spacer sample and demonstration provided. - Daily controller medication(s): Singulair 10mg  daily and Symbicort  80/4.35mcg two puffs twice daily with spacer - Prior to physical activity: albuterol 2 puffs 10-15 minutes before physical activity. - Rescue medications: albuterol 4 puffs every 4-6 hours as needed - Asthma control goals:  * Full participation in all desired activities (may need albuterol before activity) * Albuterol use two time or less a week on average (not counting use with activity) * Cough interfering with sleep two time or less a month * Oral steroids no more than once a year * No hospitalizations  3. Anaphylactic shock due to food (tree nuts) - Testing was positive to cashew, pecan, and pistachio. - We could introduce other tree nuts since they were negative, but I think it is best to just avoid the entire class of foods. Anna Hanson training provided. - Anaphylaxis management plan provided.  4. Return in about 6 weeks (around 09/13/2019). This can be an in-person, a virtual Webex or a telephone follow up visit.  Subjective:   Anna Hanson is a 54 y.o. female presenting today for evaluation of  Chief Complaint  Patient presents with  . Asthma    Anna Hanson has a history of the following: Patient Active Problem List   Diagnosis Date Noted  . Seasonal and perennial allergic rhinitis 08/02/2019  . Mild persistent asthma, uncomplicated 123XX123  . Anaphylactic shock due to adverse food reaction 08/02/2019  . Family hx of colon cancer 02/08/2018    History obtained from: chart review and patient.  Anna Hanson was referred by Anna Bis, MD.   Anna Hanson is a 54 y.o. female presenting for an evaluation of asthma and allergies.   Asthma/Respiratory Symptom History: She was  idagnosed with asthma when she was 54 years of age. It was definitely worse when she was youngetr, but improved over time. She has noticed that over the past year, she will have some SOB with physical activity. She does notice that if she overeats, she can tell that she is out of breath. She  was never on a maintenance medication even when she was younger. She has prednisone very rarely. She was never on prednisone even over this past year. There does not seem to be a difference during the time of the year. Physical activity is a bad trigger for her. She does not get worse when she is sick.   Allergic Rhinitis Symptom History: She will have spells of environmental allergies. She will take chlorotabs as needed. She denies sinus infections. She has not had one in less than five years. She does report a lot of phlegm production and throat clearing through the year. There are no animals at home. Cats definitely affect her breathing.  Food Allergy Symptom History: She does have reactions to pecans and cashews. She reports throat swelling and lip tingling. She is fine with peanuts. She avoids other tree nuts. Her last exposure to pecans and cashews was years ago. She does have an expired EpiPen. She was tested at some point, she estimates that this was ten years ago.   Otherwise, there is no history of other atopic diseases, including drug allergies, stinging insect allergies, eczema, urticaria or contact dermatitis. There is no significant infectious history. Vaccinations are up to date.    Past Medical History: Patient Active Problem List   Diagnosis Date Noted  . Seasonal and perennial allergic rhinitis 08/02/2019  . Mild persistent asthma, uncomplicated 123XX123  . Anaphylactic shock due to adverse food reaction 08/02/2019  . Family hx of colon cancer 02/08/2018    Medication List:  Allergies as of 08/02/2019      Reactions   Other Anaphylaxis   Cashews and Pecans      Medication List       Accurate as of August 02, 2019 12:49 PM. If you have any questions, ask your nurse or doctor.        albuterol 108 (90 Base) MCG/ACT inhaler Commonly known as: VENTOLIN HFA Inhale into the lungs.   albuterol 108 (90 Base) MCG/ACT inhaler Commonly known as: VENTOLIN HFA Inhale 1-2  puffs into the lungs every 6 (six) hours as needed for wheezing or shortness of breath.   budesonide-formoterol 80-4.5 MCG/ACT inhaler Commonly known as: Symbicort Inhale 2 puffs into the lungs 2 (two) times daily. Started by: Anna Shaggy, MD   fluticasone 50 MCG/ACT nasal spray Commonly known as: FLONASE Place 1 spray into both nostrils daily. Started by: Anna Shaggy, MD   ibuprofen 200 MG tablet Commonly known as: ADVIL Take 800 mg by mouth every 8 (eight) hours as needed (pain/headaches.).   levocetirizine 5 MG tablet Commonly known as: XYZAL Take 1 tablet (5 mg total) by mouth every evening. Started by: Anna Shaggy, MD   Levora 0.15/30 (28) 0.15-30 MG-MCG tablet Generic drug: levonorgestrel-ethinyl estradiol Take 1 tablet by mouth at bedtime.   metoprolol succinate 50 MG 24 hr tablet Commonly known as: TOPROL-XL Take 50 mg by mouth daily. Take with or immediately following a meal.   montelukast 10 MG tablet Commonly known as: SINGULAIR Take 1 tablet (10 mg total) by mouth at bedtime. Started by: Anna Shaggy, MD   omeprazole 20 MG capsule Commonly known as:  PRILOSEC Take 20 mg by mouth daily before breakfast.       Birth History: non-contributory  Developmental History: non-contributory  Past Surgical History: Past Surgical History:  Procedure Laterality Date  . CHOLECYSTECTOMY    . COLONOSCOPY    . COLONOSCOPY N/A 01/24/2013   Procedure: COLONOSCOPY;  Surgeon: Rogene Houston, MD;  Location: [facility];  Service: Endoscopy;  Laterality: N/A;  730  . COLONOSCOPY N/A 05/10/2018   Procedure: COLONOSCOPY;  Surgeon: Rogene Houston, MD;  Location: [facility];  Service: Endoscopy;  Laterality: N/A;  830  . POLYPECTOMY  05/10/2018   Procedure: POLYPECTOMY;  Surgeon: Rogene Houston, MD;  Location: [facility];  Service: Endoscopy;;  transverse     Family History: Family History  Problem Relation Age of Onset  . Colon  cancer Father      Social History: Anisah lives at home with her family. They live in a house that is 52 years old. There is carpeting throughout the home. They have electric heating and heat pump for cooling. There are no animals inside or outside of the home. There are no dust mite coverings on the bedding. There is no tobacco exposure. 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 There is no dust mite or fume exposure.   Review of Systems  Constitutional: Negative.  Negative for chills, fever, malaise/fatigue and weight loss.  HENT: Positive for congestion. Negative for ear discharge and ear pain.        Positive for postnasal drip and throat clearing.  Eyes: Negative for pain, discharge and redness.  Respiratory: Positive for cough and shortness of breath. Negative for sputum production and wheezing.   Cardiovascular: Negative.  Negative for chest pain and palpitations.  Gastrointestinal: Negative for abdominal pain, diarrhea, heartburn, nausea and vomiting.  Skin: Negative.  Negative for itching and rash.  Neurological: Negative for dizziness and headaches.  Endo/Heme/Allergies: Negative for environmental allergies. Does not bruise/bleed easily.       Objective:   Blood pressure 134/78, pulse 86, temperature 97.7 F (36.5 C), temperature source Temporal, resp. rate (!) 22, height 5' 4.5" (1.638 m), weight 197 lb 9.6 oz (89.6 kg), SpO2 97 %. Body mass index is 33.39 kg/m.   Physical Exam:   Physical Exam  Constitutional: She appears well-developed.  HENT:  Head: Normocephalic and atraumatic.  Right Ear: Tympanic membrane, external ear and ear canal normal. No drainage, swelling or tenderness. Tympanic membrane is not injected, not scarred, not erythematous, not retracted and not bulging.  Left Ear: Tympanic membrane, external ear and ear canal normal. No drainage, swelling or tenderness. Tympanic membrane is not injected, not  scarred, not erythematous, not retracted and not bulging.  Nose: Mucosal edema and rhinorrhea present. No nasal deformity or septal deviation. No epistaxis. Right sinus exhibits no maxillary sinus tenderness and no frontal sinus tenderness. Left sinus exhibits no maxillary sinus tenderness and no frontal sinus tenderness.  Mouth/Throat: Uvula is midline and oropharynx is clear and moist. Mucous membranes are not pale and not dry.  Tonsils normal bilaterally. There is some cobblestoning appreciated in the posterior oropharynx.   Eyes: Pupils are equal, round, and reactive to light. Conjunctivae and EOM are normal. Right eye exhibits no chemosis and no discharge. Left eye exhibits no chemosis and no discharge. Right conjunctiva is not injected. Left conjunctiva is not injected.  Cardiovascular: Normal rate, regular rhythm and normal heart sounds.  Respiratory:  Effort normal and breath sounds normal. No accessory muscle usage. No tachypnea. No respiratory distress. She has no wheezes. She has no rhonchi. She has no rales. She exhibits no tenderness.  Moving air well in all lung fields. No increased work of breathing.   GI: There is no abdominal tenderness. There is no rebound and no guarding.  Lymphadenopathy:       Head (right side): No submandibular, no tonsillar and no occipital adenopathy present.       Head (left side): No submandibular, no tonsillar and no occipital adenopathy present.    She has no cervical adenopathy.  Neurological: She is alert.  Skin: No abrasion, no petechiae and no rash noted. Rash is not papular, not vesicular and not urticarial. No erythema. No pallor.  No eczematous or urticarial lesions noted.   Psychiatric: She has a normal mood and affect.     Diagnostic studies:    Spirometry: results normal (FEV1: 2.28/81%, FVC: 2.87/81%, FEV1/FVC: 79%).    Spirometry consistent with normal pattern. We did give four puffs of albuterol MDI via spacer in clinic with improvement  in FEV1, but not significant per ATS criteria. The FEF25-75% did increase 17%.   Allergy Studies:    Airborne Adult Perc - 08/02/19 1007    Allergen Manufacturer  Lavella Hammock    Location  Back    Number of Test  59    Panel 1  Select    1. Control-Buffer 50% Glycerol  Negative    2. Control-Histamine 1 mg/ml  Negative    3. Albumin saline  Negative    4. [place]  Negative    5. [place]  Negative    6. Johnson  Negative    7. Stanton Blue  Negative    8. Meadow Fescue  Negative    9. Perennial Rye  Negative    10. Sweet Vernal  Negative    11. Timothy  Negative    12. Cocklebur  Negative    13. Burweed Marshelder  Negative    14. Ragweed, short  Negative    15. Ragweed, Giant  Negative    16. Plantain,  English  Negative    17. Lamb's Quarters  Negative    18. Sheep Sorrell  Negative    19. Rough Pigweed  Negative    20. Marsh Elder, Rough  Negative    21. Mugwort, Common  Negative    22. Ash mix  Negative    23. Birch mix  Negative    24. Beech American  Negative    25. Box, Elder  Negative    26. Cedar, red  Negative    27. Cottonwood, [place]  Negative    28. Elm mix  Negative    29. Hickory mix  Negative    30. Maple mix  Negative    31. Oak, [place] mix  Negative    32. Pecan Pollen  Negative    33. Pine mix  Negative    34. Sycamore Eastern  Negative    35. [place], Black Pollen  Negative    36. Alternaria alternata  Negative    37. Cladosporium Herbarum  Negative    38. Aspergillus mix  Negative    39. Penicillium mix  Negative    40. Bipolaris sorokiniana (Helminthosporium)  Negative    41. Drechslera spicifera (Curvularia)  Negative    42. Mucor plumbeus  Negative    43. Fusarium moniliforme  Negative    44. Aureobasidium pullulans (pullulara)  Negative    45. Rhizopus oryzae  Negative    46. Botrytis cinera  Negative    47. Epicoccum nigrum  Negative    49. Candida Albicans  Negative    50. Trichophyton mentagrophytes  Negative    51. Mite, D Farinae  5,000  AU/ml  Negative    52. Mite, D Pteronyssinus  5,000 AU/ml  Negative    53. Cat Hair 10,000 BAU/ml  3+    54.  Dog Epithelia  Negative    55. Mixed Feathers  Negative    56. Horse Epithelia  2+    57. Cockroach, German  Negative    58. Mouse  Negative    59. Tobacco Leaf  Negative     Intradermal - 08/02/19 1130    Control  Negative    [place]  Negative    Johnson  Negative    7 Grass  3+    Ragweed mix  Negative    Weed mix  Negative    Tree mix  Negative    Mold 1  Negative    Mold 2  Negative    Mold 3  Negative    Mold 4  1+    Dog  3+    Cockroach  Negative    Mite mix  3+    Other  Omitted     Food Adult Perc - 08/02/19 1000    Time Antigen Placed  1008    Allergen Manufacturer  Greer    Location  Back    Number of allergen test  8    10. Cashew  --   20x45   11. Pecan Food  --   5x25   12. [place]  Negative    13. Almond  Negative    14. Hazelnut  Negative    15. [place] nut  Negative    16. Coconut  Negative    17. Pistachio  --   7x45      Allergy testing results were read and interpreted by myself, documented by clinical staff.         Salvatore Marvel, MD [facility] [place] [facility] [place]

## 2019-08-02 NOTE — Patient Instructions (Addendum)
1. Chronic rhinitis - Testing today showed: grasses, indoor molds, dust mites, cat and dog - Copy of test results provided.  - Avoidance measures provided. - Stop taking: Chlortab - Start taking: Xyzal (levocetirizine) 5mg  tablet once daily, Singulair (montelukast) 10mg  daily and Flonase (fluticasone) one spray per nostril daily - You can use an extra dose of the antihistamine, if needed, for breakthrough symptoms.  - Consider nasal saline rinses 1-2 times daily to remove allergens from the nasal cavities as well as help with mucous clearance (this is especially helpful to do before the nasal sprays are given) - Consider allergy shots as a means of long-term control. - Allergy shots "re-train" and "reset" the immune system to ignore environmental allergens and decrease the resulting immune response to those allergens (sneezing, itchy watery eyes, runny nose, nasal congestion, etc).    - Allergy shots improve symptoms in 75-85% of patients.  - We can discuss more at the next appointment if the medications are not working for you.  2. Mild persistent asthma, uncomplicated - Lung testing was technically normal, but you did improve ever so slightly with the albuterol treatment. - This points towards a diagnosis of asthma, especially in combination with your clinical improvement with the albuterol.  - I do think that you need a daily controller medication for your breathing. - I am going to start Symbicort, which contains a long acting albuterol combined with an inhaled  - Spacer sample and demonstration provided. - Daily controller medication(s): Singulair 10mg  daily and Symbicort 80/4.102mcg two puffs twice daily with spacer - Prior to physical activity: albuterol 2 puffs 10-15 minutes before physical activity. - Rescue medications: albuterol 4 puffs every 4-6 hours as needed - Asthma control goals:  * Full participation in all desired activities (may need albuterol before activity) * Albuterol  use two time or less a week on average (not counting use with activity) * Cough interfering with sleep two time or less a month * Oral steroids no more than once a year * No hospitalizations  3. Anaphylactic shock due to food (tree nuts) - Testing was positive to cashew, pecan, and pistachio. - We could introduce other tree nuts since they were negative, but I think it is best to just avoid the entire class of foods. [name] training provided. - Anaphylaxis management plan provided.  4. Return in about 6 weeks (around 09/13/2019). This can be an in-person, a virtual Webex or a telephone follow up visit.   Please inform us of any Emergency Department visits, hospitalizations, or changes in symptoms. Call us before going to the ED for breathing or allergy symptoms since we might be able to fit you in for a sick visit. Feel free to contact us anytime with any questions, problems, or concerns.  It was a pleasure to meet you today!  Websites that have reliable patient information: 1. American Academy of Asthma, Allergy, and Immunology: www.aaaai.org 2. Food Allergy Research and Education (FARE): foodallergy.org 3. Mothers of Asthmatics: http://www.asthmacommunitynetwork.org 4. American College of Allergy, Asthma, and Immunology: www.acaai.org   COVID-19 Vaccine Information can be found at: [email or web address].uk For questions related to vaccine distribution or appointments, please email [email or web address] or call [phone].     "Like" [place] on Facebook and Instagram for our latest updates!       HAPPY SPRING!  Make sure you are registered to vote! If you have moved or changed any of your contact information, you will need to get this updated before voting!  In some  cases, you MAY be able to register to vote online: [email or web address]    Reducing Pollen Exposure  The American Academy of  Allergy, Asthma and Immunology suggests the following steps to reduce your exposure to pollen during allergy seasons.    1. Do not hang sheets or clothing out to dry; pollen may collect on these items. 2. Do not mow lawns or spend time around freshly cut grass; mowing stirs up pollen. 3. Keep windows closed at night.  Keep car windows closed while driving. 4. Minimize morning activities outdoors, a time when pollen counts are usually at their highest. 5. Stay indoors as much as possible when pollen counts or humidity is high and on windy days when pollen tends to remain in the air longer. 6. Use air conditioning when possible.  Many air conditioners have filters that trap the pollen spores. 7. Use a HEPA room air filter to remove pollen form the indoor air you breathe.  Control of Mold Allergen   Mold and fungi can grow on a variety of surfaces provided certain temperature and moisture conditions exist.  Outdoor molds grow on plants, decaying vegetation and soil.  The major outdoor mold, Alternaria and Cladosporium, are found in very high numbers during hot and dry conditions.  Generally, a late Summer - Fall peak is seen for common outdoor fungal spores.  Rain will temporarily lower outdoor mold spore count, but counts rise rapidly when the rainy period ends.  The most important indoor molds are Aspergillus and Penicillium.  Dark, humid and poorly ventilated basements are ideal sites for mold growth.  The next most common sites of mold growth are the bathroom and the kitchen.  Indoor (Perennial) Mold Control   Positive indoor molds via skin testing: Fusarium, Aureobasidium (Pullulara) and Rhizopus  1. Maintain humidity below 50%. 2. Clean washable surfaces with 5% bleach solution. 3. Remove sources e.g. contaminated carpets.     Control of Dog or Cat Allergen  Avoidance is the best way to manage a dog or cat allergy. If you have a dog or cat and are allergic to dog or cats, consider  removing the dog or cat from the home. If you have a dog or cat but don't want to find it a new home, or if your family wants a pet even though someone in the household is allergic, here are some strategies that may help keep symptoms at bay:  1. Keep the pet out of your bedroom and restrict it to only a few rooms. Be advised that keeping the dog or cat in only one room will not limit the allergens to that room. 2. Don't pet, hug or kiss the dog or cat; if you do, wash your hands with soap and water. 3. High-efficiency particulate air (HEPA) cleaners run continuously in a bedroom or living room can reduce allergen levels over time. 4. Regular use of a high-efficiency vacuum cleaner or a central vacuum can reduce allergen levels. 5. Giving your dog or cat a bath at least once a week can reduce airborne allergen.  Control of Dust Mite Allergen    Dust mites play a major role in allergic asthma and rhinitis.  They occur in environments with high humidity wherever human skin is found.  Dust mites absorb humidity from the atmosphere (ie, they do not drink) and feed on organic matter (including shed human and animal skin).  Dust mites are a microscopic type of insect that you cannot see with the naked eye.  High  levels of dust mites have been detected from mattresses, pillows, carpets, upholstered furniture, bed covers, clothes, soft toys and any woven material.  The principal allergen of the dust mite is found in its feces.  A gram of dust may contain 1,000 mites and 250,000 fecal particles.  Mite antigen is easily measured in the air during house cleaning activities.  Dust mites do not bite and do not cause harm to humans, other than by triggering allergies/asthma.    Ways to decrease your exposure to dust mites in your home:  1. Encase mattresses, box springs and pillows with a mite-impermeable barrier or cover   2. Wash sheets, blankets and drapes weekly in hot water (130 F) with detergent and dry  them in a dryer on the hot setting.  3. Have the room cleaned frequently with a vacuum cleaner and a damp dust-mop.  For carpeting or rugs, vacuuming with a vacuum cleaner equipped with a high-efficiency particulate air (HEPA) filter.  The dust mite allergic individual should not be in a room which is being cleaned and should wait 1 hour after cleaning before going into the room. 4. Do not sleep on upholstered furniture (eg, couches).   5. If possible removing carpeting, upholstered furniture and drapery from the home is ideal.  Horizontal blinds should be eliminated in the rooms where the person spends the most time (bedroom, study, television room).  Washable vinyl, roller-type shades are optimal. 6. Remove all non-washable stuffed toys from the bedroom.  Wash stuffed toys weekly like sheets and blankets above.   7. Reduce indoor humidity to less than 50%.  Inexpensive humidity monitors can be purchased at most hardware stores.  Do not use a humidifier as can make the problem worse and are not recommended.  Allergy Shots   Allergies are the result of a chain reaction that starts in the immune system. Your immune system controls how your body defends itself. For instance, if you have an allergy to pollen, your immune system identifies pollen as an invader or allergen. Your immune system overreacts by producing antibodies called Immunoglobulin E (IgE). These antibodies travel to cells that release chemicals, causing an allergic reaction.  The concept behind allergy immunotherapy, whether it is received in the form of shots or tablets, is that the immune system can be desensitized to specific allergens that trigger allergy symptoms. Although it requires time and patience, the payback can be long-term relief.  How Do Allergy Shots Work?  Allergy shots work much like a vaccine. Your body responds to injected amounts of a particular allergen given in increasing doses, eventually developing a resistance  and tolerance to it. Allergy shots can lead to decreased, minimal or no allergy symptoms.  There generally are two phases: build-up and maintenance. Build-up often ranges from three to six months and involves receiving injections with increasing amounts of the allergens. The shots are typically given once or twice a week, though more rapid build-up schedules are sometimes used.  The maintenance phase begins when the most effective dose is reached. This dose is different for each person, depending on how allergic you are and your response to the build-up injections. Once the maintenance dose is reached, there are longer periods between injections, typically two to four weeks.  Occasionally doctors give cortisone-type shots that can temporarily reduce allergy symptoms. These types of shots are different and should not be confused with allergy immunotherapy shots.  Who Can Be Treated with Allergy Shots?  Allergy shots may be a  good treatment approach for people with allergic rhinitis (hay fever), allergic asthma, conjunctivitis (eye allergy) or stinging insect allergy.   Before deciding to begin allergy shots, you should consider:  . The length of allergy season and the severity of your symptoms . Whether medications and/or changes to your environment can control your symptoms . Your desire to avoid long-term medication use . Time: allergy immunotherapy requires a major time commitment . Cost: may vary depending on your insurance coverage  Allergy shots for children age 31 and older are effective and often well tolerated. They might prevent the onset of new allergen sensitivities or the progression to asthma.  Allergy shots are not started on patients who are pregnant but can be continued on patients who become pregnant while receiving them. In some patients with other medical conditions or who take certain common medications, allergy shots may be of risk. It is important to mention other  medications you talk to your allergist.   When Will I Feel Better?  Some may experience decreased allergy symptoms during the build-up phase. For others, it may take as long as 12 months on the maintenance dose. If there is no improvement after a year of maintenance, your allergist will discuss other treatment options with you.  If you aren't responding to allergy shots, it may be because there is not enough dose of the allergen in your vaccine or there are missing allergens that were not identified during your allergy testing. Other reasons could be that there are high levels of the allergen in your environment or major exposure to non-allergic triggers like tobacco smoke.  What Is the Length of Treatment?  Once the maintenance dose is reached, allergy shots are generally continued for three to five years. The decision to stop should be discussed with your allergist at that time. Some people may experience a permanent reduction of allergy symptoms. Others may relapse and a longer course of allergy shots can be considered.  What Are the Possible Reactions?  The two types of adverse reactions that can occur with allergy shots are local and systemic. Common local reactions include very mild redness and swelling at the injection site, which can happen immediately or several hours after. A systemic reaction, which is less common, affects the entire body or a particular body system. They are usually mild and typically respond quickly to medications. Signs include increased allergy symptoms such as sneezing, a stuffy nose or hives.  Rarely, a serious systemic reaction called anaphylaxis can develop. Symptoms include swelling in the throat, wheezing, a feeling of tightness in the chest, nausea or dizziness. Most serious systemic reactions develop within 30 minutes of allergy shots. This is why it is strongly recommended you wait in your doctor's office for 30 minutes after your injections. Your allergist is  trained to watch for reactions, and his or her staff is trained and equipped with the proper medications to identify and treat them.  Who Should Administer Allergy Shots?  The preferred location for receiving shots is your prescribing allergist's office. Injections can sometimes be given at another facility where the physician and staff are trained to recognize and treat reactions, and have received instructions by your prescribing allergist.

## 2019-08-05 ENCOUNTER — Telehealth: Payer: Self-pay

## 2019-08-05 NOTE — Telephone Encounter (Signed)
Add on triamcinolone 0.1% ointment 2-3 times daily. Also add on ice packs.   Salvatore Marvel, MD [facility]

## 2019-08-05 NOTE — Telephone Encounter (Addendum)
Pt was seen last week and had some intradermals done.  Pt states that one of them is puffed up, warm to the touch, red, rough and itchy.  Would like to know what to do.  Explained to patient that she could start by using some hydrocortisone cream and her antihistamines.  Explained to her that someone would call her back with any other recommendations. Pt verbalized understanding, call ended.  Dr Ernst Bowler please advise

## 2019-08-06 NOTE — Telephone Encounter (Signed)
Call to patient, arm is better today.  Does not want the triamcinolone ointment.  Explained to patient that if she needs the prescription, just call us back and we can send it in.  Pt was okay with this plan.  Pt states that she will try the ice packs.  Call ended.

## 2019-09-13 ENCOUNTER — Ambulatory Visit: Payer: BC Managed Care – PPO | Admitting: Allergy & Immunology

## 2019-09-13 ENCOUNTER — Other Ambulatory Visit: Payer: Self-pay

## 2019-09-13 ENCOUNTER — Encounter: Payer: Self-pay | Admitting: Allergy & Immunology

## 2019-09-13 VITALS — BP 136/88 | HR 86 | Temp 97.7°F | Resp 18

## 2019-09-13 DIAGNOSIS — J3089 Other allergic rhinitis: Secondary | ICD-10-CM

## 2019-09-13 DIAGNOSIS — J4530 Mild persistent asthma, uncomplicated: Secondary | ICD-10-CM | POA: Diagnosis not present

## 2019-09-13 DIAGNOSIS — T7800XD Anaphylactic reaction due to unspecified food, subsequent encounter: Secondary | ICD-10-CM | POA: Diagnosis not present

## 2019-09-13 DIAGNOSIS — J302 Other seasonal allergic rhinitis: Secondary | ICD-10-CM | POA: Diagnosis not present

## 2019-09-13 MED ORDER — CARBINOXAMINE MALEATE 6 MG PO TABS: 1.0000 | ORAL_TABLET | Freq: Three times a day (TID) | ORAL | 5 refills | Status: DC | PRN

## 2019-09-13 MED ORDER — EPINEPHRINE 0.3 MG/0.3ML IJ SOAJ: 0.3000 mg | Freq: Once | INTRAMUSCULAR | 2 refills | Status: AC

## 2019-09-13 NOTE — Patient Instructions (Addendum)
1. Chronic rhinitis (grasses, indoor molds, dust mites, cat and dog) - Stop the Xyzal and start RyVent one tablet every 8 hours as needed. - This will come from a specialty pharmacy.  - Samples provided. - Continue taking: Singulair (montelukast) 10mg  daily and Flonase (fluticasone) one spray per nostril daily - Call us next week if this is not working.  - We could always pursue allergy shots if this does not provide any more improvement.   2. Mild persistent asthma, uncomplicated - Lung testing was normal. - Stop the Symbicort since you did not seem to have any improvement with this.  - Daily controller medication(s): Singulair 10mg  daily and Symbicort 80/4.83mcg two puffs twice daily with spacer - Prior to physical activity: albuterol 2 puffs 10-15 minutes before physical activity. - Rescue medications: albuterol 4 puffs every 4-6 hours as needed - Asthma control goals:  * Full participation in all desired activities (may need albuterol before activity) * Albuterol use two time or less a week on average (not counting use with activity) * Cough interfering with sleep two time or less a month * Oral steroids no more than once a year * No hospitalizations  3. Anaphylactic shock due to food (tree nuts) - Continue to avoid tree nuts.  [name] training provided. - Anaphylaxis management plan provided.  4. Return in about 6 weeks (around 10/25/2019). This can be an in-person, a virtual Webex or a telephone follow up visit.   Please inform us of any Emergency Department visits, hospitalizations, or changes in symptoms. Call us before going to the ED for breathing or allergy symptoms since we might be able to fit you in for a sick visit. Feel free to contact us anytime with any questions, problems, or concerns.  It was a pleasure to see you again today!  Websites that have reliable patient information: 1. American Academy of Asthma, Allergy, and Immunology: www.aaaai.org 2. Food Allergy  Research and Education (FARE): foodallergy.org 3. Mothers of Asthmatics: http://www.asthmacommunitynetwork.org 4. American College of Allergy, Asthma, and Immunology: www.acaai.org   COVID-19 Vaccine Information can be found at: ShippingScam.co.uk For questions related to vaccine distribution or appointments, please email [email or web address] or call [phone].     "Like" [place] on Facebook and Instagram for our latest updates!       HAPPY SPRING!  Make sure you are registered to vote! If you have moved or changed any of your contact information, you will need to get this updated before voting!  In some cases, you MAY be able to register to vote online: [email or web address]

## 2019-09-13 NOTE — Progress Notes (Signed)
FOLLOW UP  Date of Service/Encounter:  09/13/19   Assessment:   Mild persistent asthma, uncomplicated  Seasonal and perennial allergic rhinitis (grasses, indoor molds, dust mites, cat and dog)  Anaphylactic shock due to food (tree nuts)   Plan/Recommendations:   1. Chronic rhinitis (grasses, indoor molds, dust mites, cat and dog) - Stop the Xyzal and start RyVent one tablet every 8 hours as needed. - This will come from a specialty pharmacy.  - Samples provided. - Continue taking: Singulair (montelukast) 10mg  daily and Flonase (fluticasone) one spray per nostril daily - Call us next week if this is not working.  - We could always pursue allergy shots if this does not provide any more improvement.   2. Mild persistent asthma, uncomplicated - Lung testing was normal. - Stop the Symbicort since you did not seem to have any improvement with this.  - Daily controller medication(s): Singulair 10mg  daily and Symbicort 80/4.61mcg two puffs twice daily with spacer - Prior to physical activity: albuterol 2 puffs 10-15 minutes before physical activity. - Rescue medications: albuterol 4 puffs every 4-6 hours as needed - Asthma control goals:  * Full participation in all desired activities (may need albuterol before activity) * Albuterol use two time or less a week on average (not counting use with activity) * Cough interfering with sleep two time or less a month * Oral steroids no more than once a year * No hospitalizations  3. Anaphylactic shock due to food (tree nuts) - Continue to avoid tree nuts.  [name] training provided. - Anaphylaxis management plan provided.  4. Return in about 6 weeks (around 10/25/2019). This can be an in-person, a virtual Webex or a telephone follow up visit.   Subjective:   Anna Hanson is a 54 y.o. female presenting today for follow up of  Chief Complaint  Patient presents with  . Asthma    Can't really tell a difference. Still the same.     . Allergies    Feels like they are doing okay.     Anna Hanson has a history of the following: Patient Active Problem List   Diagnosis Date Noted  . Seasonal and perennial allergic rhinitis 08/02/2019  . Mild persistent asthma, uncomplicated [id number]  . Anaphylactic shock due to adverse food reaction 08/02/2019  . Family hx of colon cancer 02/08/2018    History obtained from: chart review and patient.  Anna Hanson is a 54 y.o. female presenting for a follow up visit.  She was last seen as a new patient in March 2021.  At that time, she had testing that was positive to grasses, indoor mold, dust mite, cat, and dog.  We stopped her Chlortab and started Xyzal, Singulair, and Flonase.  Her lung testing actually looked normal but it did improve ever so slightly with the albuterol treatment.  We ended up starting Symbicort 80/4.5 mcg 2 puffs twice daily to see if this would provide any relief.  She had testing that was positive to tree nuts.  We recommended avoidance and provided her with an Auvi-Q epinephrine autoinjector.  Since last visit, she has done well.  Asthma/Respiratory Symptom History: She reports no change in breathing symptoms with the addition of  Symbicort 80/4.5- 2 puffs twice a day and Singulair 10 mg once a day. She has used her albuterol inhaler one to two times since she was last seen and reports that this will resolve her symptoms. She reports shortness of breath with rest. She is  uncertain about exertion because she has not exercised since she was last seen. She denies any coughing, wheezing, tightness in chest or shortness of breath. She has not been on any systemic steroids or made any trips to the emergency room or urgant care since her last office visit.  She does use a spacer, but tells me that it whistles all the time.  Allergic Rhinitis Symptom History: She reports that her allergy symptoms are better since she was last seen with the use of xyzal 5 mg once a day and  Flonase 2 sprays each nostril once a day.. She has been able to be around animals without any symptoms. She reports post nasal drainage and denies rhinorrhea, nasal congestion and sneezing.  Food Allergy Symptom History: She continues to avoid treenuts and has not had any accidental ingestion. She does report that her EpiPen is exprired and needs a refill.   Otherwise, there have been no changes to her past medical history, surgical history, family history, or social history.    Review of Systems  Constitutional: Negative for chills and fever.  HENT: Negative for congestion.        Reports post nasal drainage  Eyes: Negative.   Respiratory: Positive for shortness of breath and wheezing. Negative for cough.   Skin: Negative for itching and rash.  Endo/Heme/Allergies: Positive for environmental allergies.       Objective:   Blood pressure 136/88, pulse 86, temperature 97.7 F (36.5 C), temperature source Temporal, resp. rate 18, SpO2 98 %. There is no height or weight on file to calculate BMI.   Physical Exam:  Physical Exam  Constitutional: She is oriented to person, place, and time. She appears well-developed.  HENT:  Head: Normocephalic and atraumatic.  Right Ear: External ear normal.  Left Ear: External ear normal.  Nose: Nose normal.  Mouth/Throat: Oropharynx is clear and moist.  Pharynx normal, Eyes normal, Ears normal. Nose normal  Eyes: Conjunctivae are normal.  Cardiovascular: Normal rate, regular rhythm and normal heart sounds.  Respiratory: Effort normal and breath sounds normal.  Lungs clear to auscultation  Neurological: She is alert and oriented to person, place, and time.  Skin: Skin is warm.  Psychiatric: She has a normal mood and affect. Her behavior is normal. Judgment and thought content normal.     Spirometry: FVC 2.77 L, FEV1 : 2.48 L. Predicted FVC 3.62, FEV1 : 2.85. Minimal increase in FEV1 from previous spirometry.    Spriometry suggests mild  restriction.   I performed a history and physical examination of the patient and discussed her management with the Nurse Practitioner. I reviewed the Nurse Practitioner's note and agree with the documented findings and plan of care. The note in its entirety was edited by myself, including the physical exam, assessment, and plan.       Salvatore Marvel, MD  [facility]

## 2020-03-20 DIAGNOSIS — L918 Other hypertrophic disorders of the skin: Secondary | ICD-10-CM | POA: Diagnosis not present

## 2020-03-20 DIAGNOSIS — L570 Actinic keratosis: Secondary | ICD-10-CM | POA: Diagnosis not present

## 2020-03-20 DIAGNOSIS — L720 Epidermal cyst: Secondary | ICD-10-CM | POA: Diagnosis not present

## 2020-03-20 DIAGNOSIS — L814 Other melanin hyperpigmentation: Secondary | ICD-10-CM | POA: Diagnosis not present

## 2020-03-20 DIAGNOSIS — L821 Other seborrheic keratosis: Secondary | ICD-10-CM | POA: Diagnosis not present

## 2021-09-06 ENCOUNTER — Ambulatory Visit (INDEPENDENT_AMBULATORY_CARE_PROVIDER_SITE_OTHER): Payer: BLUE CROSS/BLUE SHIELD | Admitting: Gastroenterology

## 2021-09-06 ENCOUNTER — Encounter (INDEPENDENT_AMBULATORY_CARE_PROVIDER_SITE_OTHER): Payer: Self-pay | Admitting: Gastroenterology

## 2021-09-06 DIAGNOSIS — K760 Fatty (change of) liver, not elsewhere classified: Secondary | ICD-10-CM | POA: Diagnosis not present

## 2021-09-06 DIAGNOSIS — R7989 Other specified abnormal findings of blood chemistry: Secondary | ICD-10-CM

## 2021-09-06 NOTE — Patient Instructions (Signed)
Continue weight loss measures, can implement Mediterranean diet if possible ?Perform blood workup ? ?

## 2021-09-06 NOTE — Progress Notes (Signed)
Anna Hanson, M.D. ?Gastroenterology & Hepatology ?[facility] ?[address] ?[place], [place] [address] ?Primary Care Physician: ?Caryl Bis, MD ?[address] ?[place] [address] ? ?Referring MD: PCP ? ?Chief Complaint: Fatty liver and elevated LFTs. ? ?History of Present Illness: ?Anna Hanson is a 56 y.o. female with no PMH asthma, hyperlipidemia, who presents for evaluation of fatty liver and elevated LFTs. ? ?Referred to the GI clinic for evaluation of elevated LFTs and fatty liver.  She denies having any symptoms.  Most recent blood work-up from 05/24/2021 CBC with white blood cell count of 7.9, hemoglobin 15.4, platelets 345, CMP with a glucose of 103, creatinine 0.9, albumin 4.5, total bilirubin 0.4, alkaline phosphatase 89, AST 84, ALT 96, lipid panel with cholesterol of 237, triglycerides 173, LDL 160, TSH 3.3.  Underwent ultrasound of the her abdomen which showed presence of fatty liver and status postcholecystectomy. Notably, she reports that she was in a hockey game and drank 6 beers the day before her labs were drawn. ? ?No previous LFTs are available but she reports that her previous LFTs were normal. ? ?The patient denies having any nausea, vomiting, fever, chills, hematochezia, melena, hematemesis, abdominal distention, abdominal pain, diarrhea, jaundice, pruritus . Has lost 35 lb for the last 2 years, which was on purpose as she has been doing Weight Watchers. ? ?Last OJJ:KKXFG ?Last Colonoscopy:05/2018 ?- One 8 mm polyp in the proximal transverse colon, removed piecemeal using a cold snare. Resected and retrieved. Clips (MR conditional) were placed. Path : TA ?- Diverticulosis in the sigmoid colon. ?- Internal hemorrhoids. ? ?Recommended to repeat in 5 years. ? ?FHx: mother has NASH cirrhosis, neg for other any gastrointestinal/liver disease, father liver cancer, father colon cancer in his 27s ?Social: neg smoking, frequent alcohol or  illicit drug use ?Surgical: cholecystectomy ? ?Past Medical History: ?Past Medical History:  ?Diagnosis Date  ? Asthma   ? BCC (basal cell carcinoma of skin)   ? Nose  ? Headache   ? Medical history non-contributory   ? Superficial basal cell carcinoma (BCC) 06/27/2008  ? Between Breasts  ? Superficial basal cell carcinoma (BCC) 06/27/2008  ? Left Mid Shin  ? Superficial basal cell carcinoma (BCC) 05/18/2016  ? Left Breast  ? ? ?Past Surgical History: ?Past Surgical History:  ?Procedure Laterality Date  ? CHOLECYSTECTOMY    ? COLONOSCOPY    ? COLONOSCOPY N/A 01/24/2013  ? Procedure: COLONOSCOPY;  Surgeon: Rogene Houston, MD;  [facility];  Service: Endoscopy;  Laterality: N/A;  730  ? COLONOSCOPY N/A 05/10/2018  ? Procedure: COLONOSCOPY;  Surgeon: Rogene Houston, MD;  [facility];  Service: Endoscopy;  Laterality: N/A;  830  ? POLYPECTOMY  05/10/2018  ? Procedure: POLYPECTOMY;  Surgeon: Rogene Houston, MD;  [facility];  Service: Endoscopy;;  transverse  ? ? ?Family History: ?Family History  ?Problem Relation Age of Onset  ? Colon cancer Father   ? ? ?Social History: ?Social History  ? ?Tobacco Use  ?Smoking Status Never  ?Smokeless Tobacco Never  ? ?Social History  ? ?Substance and Sexual Activity  ?Alcohol Use No  ? ?Social History  ? ?Substance and Sexual Activity  ?Drug Use No  ? ? ?Allergies: ?Allergies  ?Allergen Reactions  ? Other Anaphylaxis  ?  Cashews and Pecans  ? ? ?Medications: ?Current Outpatient Medications  ?Medication Sig Dispense Refill  ? albuterol (PROVENTIL HFA;VENTOLIN HFA) 108 (90  Base) MCG/ACT inhaler Inhale 1-2 puffs into the lungs every 6 (six) hours as needed for wheezing or shortness of breath.    ? ibuprofen (ADVIL,MOTRIN) 200 MG tablet Take 800 mg by mouth every 8 (eight) hours as needed (pain/headaches.).    ? olmesartan (BENICAR) 20 MG tablet Take 20 mg by mouth daily.    ? omeprazole (PRILOSEC) 20 MG capsule Take 20 mg by mouth daily before  breakfast.    ? OVER THE COUNTER MEDICATION daily at 6 (six) AM. Vit D 3 once per day.    ? OVER THE COUNTER MEDICATION Vit C once per day.    ? zinc gluconate 50 MG tablet Take 50 mg by mouth daily.    ? ?No current facility-administered medications for this visit.  ? ? ?Review of Systems: ?GENERAL: negative for malaise, night sweats ?HEENT: No changes in hearing or vision, no nose bleeds or other nasal problems. ?NECK: Negative for lumps, goiter, pain and significant neck swelling ?RESPIRATORY: Negative for cough, wheezing ?CARDIOVASCULAR: Negative for chest pain, leg swelling, palpitations, orthopnea ?GI: SEE HPI ?MUSCULOSKELETAL: Negative for joint pain or swelling, back pain, and muscle pain. ?SKIN: Negative for lesions, rash ?PSYCH: Negative for sleep disturbance, mood disorder and recent psychosocial stressors. ?HEMATOLOGY Negative for prolonged bleeding, bruising easily, and swollen nodes. ?ENDOCRINE: Negative for cold or heat intolerance, polyuria, polydipsia and goiter. ?NEURO: negative for tremor, gait imbalance, syncope and seizures. ?The remainder of the review of systems is noncontributory. ? ? ?Physical Exam: ?BP 130/83 (BP Location: Left Arm, Patient Position: Sitting, Cuff Size: Large)   Pulse 80   Temp 97.8 ?F (36.6 ?C) (Oral)   Ht 5' 4.5" (1.638 m)   Wt 183 lb 3.2 oz (83.1 kg)   BMI 30.96 kg/m?  ?GENERAL: The patient is AO x3, in no acute distress. ?HEENT: Head is normocephalic and atraumatic. EOMI are intact. Mouth is well hydrated and without lesions. ?NECK: Supple. No masses ?LUNGS: Clear to auscultation. No presence of rhonchi/wheezing/rales. Adequate chest expansion ?HEART: RRR, normal s1 and s2. ?ABDOMEN: Soft, nontender, no guarding, no peritoneal signs, and nondistended. BS +. No masses. ?EXTREMITIES: Without any cyanosis, clubbing, rash, lesions or edema. ?NEUROLOGIC: AOx3, no focal motor deficit. ?SKIN: no jaundice, no rashes ? ? ?Imaging/Labs: ?as above ? ?I personally reviewed and  interpreted the available labs, imaging and endoscopic files. ? ?Impression and Plan: ?Anna Hanson is a 56 y.o. female with no PMH asthma, hyperlipidemia, who presents for evaluation of fatty liver and elevated LFTs.  Patient had mild elevation of her aminotransferases during her most recent blood work-up at her PCPs office.  She was found to have changes in ultrasound suggestive of fatty liver.  The patient has no risk factors for NASH.  I explained to her that it is possible that she has a mild elevation of her aminotransferases after drinking alcohol.  However, given the presence of findings of fatty liver in her most recent ultrasound, we will repeat a CMP I will check as well for other causes of elevated LFTs.  No further imaging is warranted as her NAFLD score is low (-3.36).  I actually congratulated her as she has been losing weight which will help for her fatty liver regardless if she has inflammation or not.  She may initially use the Mediterranean diet to her current dietary habits.  Patient understood and agreed. ? ?Continue weight loss measures, can implement Mediterranean diet if possible ?- Check CMP, acute hepatitis panel, iron panel, ANA, ASMA, IgG ? ?  All questions were answered.     ? ?Anna Peppers, MD ?Gastroenterology and Hepatology ?[facility] ? ?

## 2021-09-11 LAB — FERRITIN: Ferritin: 31 ng/mL (ref 16–232)

## 2021-09-11 LAB — COMPREHENSIVE METABOLIC PANEL
AG Ratio: 1.5 (calc) (ref 1.0–2.5)
ALT: 44 U/L — ABNORMAL HIGH (ref 6–29)
AST: 39 U/L — ABNORMAL HIGH (ref 10–35)
Albumin: 4.4 g/dL (ref 3.6–5.1)
Alkaline phosphatase (APISO): 77 U/L (ref 37–153)
BUN: 18 mg/dL (ref 7–25)
CO2: 25 mmol/L (ref 20–32)
Calcium: 9.8 mg/dL (ref 8.6–10.4)
Chloride: 106 mmol/L (ref 98–110)
Creat: 0.87 mg/dL (ref 0.50–1.03)
Globulin: 3 g/dL (calc) (ref 1.9–3.7)
Glucose, Bld: 90 mg/dL (ref 65–99)
Potassium: 4.8 mmol/L (ref 3.5–5.3)
Sodium: 141 mmol/L (ref 135–146)
Total Bilirubin: 0.3 mg/dL (ref 0.2–1.2)
Total Protein: 7.4 g/dL (ref 6.1–8.1)

## 2021-09-11 LAB — IRON, TOTAL/TOTAL IRON BINDING CAP
%SAT: 17 % (calc) (ref 16–45)
Iron: 67 ug/dL (ref 45–160)
TIBC: 394 mcg/dL (calc) (ref 250–450)

## 2021-09-11 LAB — HEPATITIS PANEL, ACUTE
Hep A IgM: NONREACTIVE
Hep B C IgM: NONREACTIVE
Hepatitis B Surface Ag: NONREACTIVE
Hepatitis C Ab: NONREACTIVE
SIGNAL TO CUT-OFF: 0.67 (ref <1.00)

## 2021-09-11 LAB — ANTI-NUCLEAR AB-TITER (ANA TITER): ANA Titer 1: 1:40 {titer} — ABNORMAL HIGH

## 2021-09-11 LAB — IGG: IgG (Immunoglobin G), Serum: 1334 mg/dL (ref 600–1640)

## 2021-09-11 LAB — ANA: Anti Nuclear Antibody (ANA): POSITIVE — AB

## 2021-09-11 LAB — ANTI-SMOOTH MUSCLE ANTIBODY, IGG: Actin (Smooth Muscle) Antibody (IGG): <20 U (ref <20)

## 2022-02-18 DIAGNOSIS — Z23 Encounter for immunization: Secondary | ICD-10-CM | POA: Diagnosis not present

## 2022-05-16 DIAGNOSIS — I10 Essential (primary) hypertension: Secondary | ICD-10-CM | POA: Diagnosis not present

## 2022-05-16 DIAGNOSIS — Z13220 Encounter for screening for lipoid disorders: Secondary | ICD-10-CM | POA: Diagnosis not present

## 2022-05-19 DIAGNOSIS — I10 Essential (primary) hypertension: Secondary | ICD-10-CM | POA: Diagnosis not present

## 2022-05-19 DIAGNOSIS — K2100 Gastro-esophageal reflux disease with esophagitis, without bleeding: Secondary | ICD-10-CM | POA: Diagnosis not present

## 2022-05-19 DIAGNOSIS — Z23 Encounter for immunization: Secondary | ICD-10-CM | POA: Diagnosis not present

## 2022-05-19 DIAGNOSIS — R5383 Other fatigue: Secondary | ICD-10-CM | POA: Diagnosis not present

## 2022-05-19 DIAGNOSIS — K760 Fatty (change of) liver, not elsewhere classified: Secondary | ICD-10-CM | POA: Diagnosis not present

## 2022-06-02 DIAGNOSIS — F419 Anxiety disorder, unspecified: Secondary | ICD-10-CM | POA: Diagnosis not present

## 2022-06-07 DIAGNOSIS — L720 Epidermal cyst: Secondary | ICD-10-CM | POA: Diagnosis not present

## 2022-06-07 DIAGNOSIS — L821 Other seborrheic keratosis: Secondary | ICD-10-CM | POA: Diagnosis not present

## 2022-06-07 DIAGNOSIS — L918 Other hypertrophic disorders of the skin: Secondary | ICD-10-CM | POA: Diagnosis not present

## 2022-06-07 DIAGNOSIS — L570 Actinic keratosis: Secondary | ICD-10-CM | POA: Diagnosis not present

## 2022-09-21 DIAGNOSIS — R300 Dysuria: Secondary | ICD-10-CM | POA: Diagnosis not present

## 2022-09-21 DIAGNOSIS — R339 Retention of urine, unspecified: Secondary | ICD-10-CM | POA: Diagnosis not present

## 2022-10-19 DIAGNOSIS — R300 Dysuria: Secondary | ICD-10-CM | POA: Diagnosis not present

## 2022-11-22 DIAGNOSIS — M722 Plantar fascial fibromatosis: Secondary | ICD-10-CM | POA: Diagnosis not present

## 2022-11-22 DIAGNOSIS — M79671 Pain in right foot: Secondary | ICD-10-CM | POA: Diagnosis not present

## 2022-11-22 DIAGNOSIS — M7731 Calcaneal spur, right foot: Secondary | ICD-10-CM | POA: Diagnosis not present

## 2022-12-06 DIAGNOSIS — E7849 Other hyperlipidemia: Secondary | ICD-10-CM | POA: Diagnosis not present

## 2022-12-06 DIAGNOSIS — I10 Essential (primary) hypertension: Secondary | ICD-10-CM | POA: Diagnosis not present

## 2022-12-06 DIAGNOSIS — Z1329 Encounter for screening for other suspected endocrine disorder: Secondary | ICD-10-CM | POA: Diagnosis not present

## 2022-12-13 DIAGNOSIS — Z23 Encounter for immunization: Secondary | ICD-10-CM | POA: Diagnosis not present

## 2022-12-13 DIAGNOSIS — I10 Essential (primary) hypertension: Secondary | ICD-10-CM | POA: Diagnosis not present

## 2022-12-13 DIAGNOSIS — M722 Plantar fascial fibromatosis: Secondary | ICD-10-CM | POA: Diagnosis not present

## 2022-12-13 DIAGNOSIS — K760 Fatty (change of) liver, not elsewhere classified: Secondary | ICD-10-CM | POA: Diagnosis not present

## 2022-12-13 DIAGNOSIS — K2100 Gastro-esophageal reflux disease with esophagitis, without bleeding: Secondary | ICD-10-CM | POA: Diagnosis not present

## 2022-12-13 DIAGNOSIS — R5383 Other fatigue: Secondary | ICD-10-CM | POA: Diagnosis not present

## 2022-12-13 DIAGNOSIS — M7731 Calcaneal spur, right foot: Secondary | ICD-10-CM | POA: Diagnosis not present

## 2022-12-13 DIAGNOSIS — M79671 Pain in right foot: Secondary | ICD-10-CM | POA: Diagnosis not present

## 2023-01-26 DIAGNOSIS — M722 Plantar fascial fibromatosis: Secondary | ICD-10-CM | POA: Diagnosis not present

## 2023-01-26 DIAGNOSIS — M2011 Hallux valgus (acquired), right foot: Secondary | ICD-10-CM | POA: Diagnosis not present

## 2023-01-26 DIAGNOSIS — M2012 Hallux valgus (acquired), left foot: Secondary | ICD-10-CM | POA: Diagnosis not present

## 2023-01-26 DIAGNOSIS — M79671 Pain in right foot: Secondary | ICD-10-CM | POA: Diagnosis not present

## 2023-03-03 DIAGNOSIS — D2272 Melanocytic nevi of left lower limb, including hip: Secondary | ICD-10-CM | POA: Diagnosis not present

## 2023-03-03 DIAGNOSIS — D485 Neoplasm of uncertain behavior of skin: Secondary | ICD-10-CM | POA: Diagnosis not present

## 2023-03-03 DIAGNOSIS — C44519 Basal cell carcinoma of skin of other part of trunk: Secondary | ICD-10-CM | POA: Diagnosis not present

## 2023-03-03 DIAGNOSIS — L814 Other melanin hyperpigmentation: Secondary | ICD-10-CM | POA: Diagnosis not present

## 2023-03-03 DIAGNOSIS — D225 Melanocytic nevi of trunk: Secondary | ICD-10-CM | POA: Diagnosis not present

## 2023-03-03 DIAGNOSIS — L570 Actinic keratosis: Secondary | ICD-10-CM | POA: Diagnosis not present

## 2023-03-03 DIAGNOSIS — L821 Other seborrheic keratosis: Secondary | ICD-10-CM | POA: Diagnosis not present

## 2023-03-15 DIAGNOSIS — L821 Other seborrheic keratosis: Secondary | ICD-10-CM | POA: Diagnosis not present

## 2023-03-15 DIAGNOSIS — C44519 Basal cell carcinoma of skin of other part of trunk: Secondary | ICD-10-CM | POA: Diagnosis not present

## 2023-04-26 ENCOUNTER — Encounter (INDEPENDENT_AMBULATORY_CARE_PROVIDER_SITE_OTHER): Payer: Self-pay | Admitting: *Deleted

## 2023-05-04 DIAGNOSIS — R350 Frequency of micturition: Secondary | ICD-10-CM | POA: Diagnosis not present

## 2023-05-04 DIAGNOSIS — J209 Acute bronchitis, unspecified: Secondary | ICD-10-CM | POA: Diagnosis not present

## 2023-05-04 DIAGNOSIS — I10 Essential (primary) hypertension: Secondary | ICD-10-CM | POA: Diagnosis not present

## 2023-05-04 DIAGNOSIS — N3000 Acute cystitis without hematuria: Secondary | ICD-10-CM | POA: Diagnosis not present

## 2023-05-18 ENCOUNTER — Telehealth (INDEPENDENT_AMBULATORY_CARE_PROVIDER_SITE_OTHER): Payer: Self-pay | Admitting: Gastroenterology

## 2023-05-18 NOTE — Telephone Encounter (Signed)
 Room 1 Thanks

## 2023-05-18 NOTE — Telephone Encounter (Signed)
 Who is your primary care physician: Dr.Terry Toribio   Reasons for the colonoscopy: Recall  Have you had a colonoscopy before?  Yes started when I was 58 yo my day had colon cancer  Do you have family history of colon cancer? yes  Previous colonoscopy with polyps removed? yes  Do you have a history colorectal cancer?   no  Are you diabetic? If yes, Type 1 or Type 2?    no  Do you have a prosthetic or mechanical heart valve? no  Do you have a pacemaker/defibrillator?   no  Have you had endocarditis/atrial fibrillation? no  Have you had joint replacement within the last 12 months?  no  Do you tend to be constipated or have to use laxatives? yes  Do you have any history of drugs or alchohol?  no  Do you use supplemental oxygen?  no  Have you had a stroke or heart attack within the last 6 months? no  Do you take weight loss medication?  no  For female patients: have you had a hysterectomy?  no                                     are you post menopausal?       no                                            do you still have your menstrual cycle? no      Do you take any blood-thinning medications such as: (aspirin, warfarin, Plavix, Aggrenox)  no  If yes we need the name, milligram, dosage and who is prescribing doctor  Current Outpatient Medications on File Prior to Visit  Medication Sig Dispense Refill   albuterol (PROVENTIL HFA;VENTOLIN HFA) 108 (90 Base) MCG/ACT inhaler Inhale 1-2 puffs into the lungs every 6 (six) hours as needed for wheezing or shortness of breath. (Patient not taking: Reported on 05/18/2023)     ibuprofen (ADVIL,MOTRIN) 200 MG tablet Take 800 mg by mouth every 8 (eight) hours as needed (pain/headaches.). (Patient not taking: Reported on 05/18/2023)     olmesartan (BENICAR) 20 MG tablet Take 20 mg by mouth daily.     omeprazole (PRILOSEC) 20 MG capsule Take 20 mg by mouth daily before breakfast.     OVER THE COUNTER MEDICATION daily at 6 (six) AM. Vit D 3 once  per day.     OVER THE COUNTER MEDICATION Vit C once per day.     zinc gluconate 50 MG tablet Take 50 mg by mouth daily.     No current facility-administered medications on file prior to visit.    Allergies  Allergen Reactions   Other Anaphylaxis    Cashews and Pecans     Pharmacy: [facility]  Primary Insurance Name: [employer] of [place]  Best number where you can be reached: [phone]

## 2023-05-24 MED ORDER — PEG 3350-KCL-NA BICARB-NACL 420 G PO SOLR: 4000.0000 mL | Freq: Once | ORAL | 0 refills | Status: AC

## 2023-05-24 NOTE — Telephone Encounter (Signed)
 Pt contacted and scheduled. Pt requested Fridays. Instructions will be sent in mail once telephone pre op has been received. Prep sent to pharmacy. No pa needed.

## 2023-05-24 NOTE — Telephone Encounter (Signed)
 Questionnaire from recall, no referral needed

## 2023-05-24 NOTE — Addendum Note (Signed)
 Addended by: Lanier Felty on: 05/24/2023 03:11 PM   Modules accepted: Orders

## 2023-06-13 ENCOUNTER — Encounter (HOSPITAL_COMMUNITY): Payer: Self-pay

## 2023-06-13 ENCOUNTER — Encounter (HOSPITAL_COMMUNITY)
Admission: RE | Admit: 2023-06-13 | Discharge: 2023-06-13 | Disposition: A | Discharge status: Home / Self Care | Payer: BLUE CROSS/BLUE SHIELD | Source: Ambulatory Visit | Attending: Gastroenterology | Admitting: Gastroenterology

## 2023-06-13 VITALS — Ht 64.5 in | Wt 185.0 lb

## 2023-06-13 DIAGNOSIS — Z01818 Encounter for other preprocedural examination: Secondary | ICD-10-CM

## 2023-06-13 DIAGNOSIS — Z800 Family history of malignant neoplasm of digestive organs: Secondary | ICD-10-CM

## 2023-06-16 ENCOUNTER — Ambulatory Visit (HOSPITAL_COMMUNITY): Payer: BLUE CROSS/BLUE SHIELD | Admitting: Certified Registered"

## 2023-06-16 ENCOUNTER — Other Ambulatory Visit: Payer: Self-pay

## 2023-06-16 ENCOUNTER — Ambulatory Visit (HOSPITAL_COMMUNITY)
Admission: RE | Admit: 2023-06-16 | Discharge: 2023-06-16 | Disposition: A | Discharge status: Home / Self Care | Payer: BLUE CROSS/BLUE SHIELD | Attending: Gastroenterology | Admitting: Gastroenterology

## 2023-06-16 ENCOUNTER — Encounter (HOSPITAL_COMMUNITY): Payer: Self-pay | Admitting: Gastroenterology

## 2023-06-16 ENCOUNTER — Encounter (HOSPITAL_COMMUNITY)
Admission: RE | Disposition: A | Discharge status: Home / Self Care | Payer: Self-pay | Source: Home / Self Care | Attending: Gastroenterology

## 2023-06-16 DIAGNOSIS — D123 Benign neoplasm of transverse colon: Secondary | ICD-10-CM | POA: Insufficient documentation

## 2023-06-16 DIAGNOSIS — Z860100 Personal history of colon polyps, unspecified: Secondary | ICD-10-CM

## 2023-06-16 DIAGNOSIS — Z79899 Other long term (current) drug therapy: Secondary | ICD-10-CM | POA: Insufficient documentation

## 2023-06-16 DIAGNOSIS — R519 Headache, unspecified: Secondary | ICD-10-CM | POA: Insufficient documentation

## 2023-06-16 DIAGNOSIS — K5730 Diverticulosis of large intestine without perforation or abscess without bleeding: Secondary | ICD-10-CM | POA: Insufficient documentation

## 2023-06-16 DIAGNOSIS — Z1211 Encounter for screening for malignant neoplasm of colon: Secondary | ICD-10-CM | POA: Insufficient documentation

## 2023-06-16 DIAGNOSIS — K635 Polyp of colon: Secondary | ICD-10-CM | POA: Diagnosis not present

## 2023-06-16 DIAGNOSIS — J45909 Unspecified asthma, uncomplicated: Secondary | ICD-10-CM | POA: Diagnosis not present

## 2023-06-16 DIAGNOSIS — K648 Other hemorrhoids: Secondary | ICD-10-CM | POA: Insufficient documentation

## 2023-06-16 DIAGNOSIS — Z01818 Encounter for other preprocedural examination: Secondary | ICD-10-CM

## 2023-06-16 DIAGNOSIS — Z800 Family history of malignant neoplasm of digestive organs: Secondary | ICD-10-CM | POA: Diagnosis not present

## 2023-06-16 HISTORY — PX: HEMOSTASIS CLIP PLACEMENT: SHX6857

## 2023-06-16 HISTORY — PX: POLYPECTOMY: SHX5525

## 2023-06-16 HISTORY — PX: SUBMUCOSAL LIFTING INJECTION: SHX6855

## 2023-06-16 HISTORY — PX: COLONOSCOPY WITH PROPOFOL: SHX5780

## 2023-06-16 LAB — POCT PREGNANCY, URINE: Preg Test, Ur: NEGATIVE

## 2023-06-16 LAB — HM COLONOSCOPY

## 2023-06-16 SURGERY — COLONOSCOPY WITH PROPOFOL
Anesthesia: General

## 2023-06-16 MED ORDER — PROPOFOL 500 MG/50ML IV EMUL
INTRAVENOUS | Status: DC | PRN
  Administered 2023-06-16: 125 ug/kg/min via INTRAVENOUS

## 2023-06-16 MED ORDER — GLYCOPYRROLATE PF 0.2 MG/ML IJ SOSY
PREFILLED_SYRINGE | INTRAMUSCULAR | Status: AC
  Filled 2023-06-16: qty 1

## 2023-06-16 MED ORDER — STERILE WATER FOR IRRIGATION IR SOLN
Status: DC | PRN
  Administered 2023-06-16: 100 mL

## 2023-06-16 MED ORDER — LACTATED RINGERS IV SOLN: INTRAVENOUS | Status: DC | PRN

## 2023-06-16 MED ORDER — SODIUM CHLORIDE 0.9% FLUSH: 3.0000 mL | INTRAVENOUS | Status: DC | PRN

## 2023-06-16 MED ORDER — PROPOFOL 1000 MG/100ML IV EMUL
INTRAVENOUS | Status: AC
  Filled 2023-06-16: qty 100

## 2023-06-16 MED ORDER — LIDOCAINE HCL (PF) 2 % IJ SOLN
INTRAMUSCULAR | Status: AC
  Filled 2023-06-16: qty 5

## 2023-06-16 MED ORDER — PROPOFOL 10 MG/ML IV BOLUS
INTRAVENOUS | Status: DC | PRN
  Administered 2023-06-16: 40 mg via INTRAVENOUS
  Administered 2023-06-16: 50 mg via INTRAVENOUS
  Administered 2023-06-16: 70 mg via INTRAVENOUS
  Administered 2023-06-16 (×2): 50 mg via INTRAVENOUS

## 2023-06-16 MED ORDER — LIDOCAINE HCL (CARDIAC) PF 100 MG/5ML IV SOSY
PREFILLED_SYRINGE | INTRAVENOUS | Status: DC | PRN
  Administered 2023-06-16: 60 mg via INTRATRACHEAL

## 2023-06-16 MED ORDER — SODIUM CHLORIDE 0.9% FLUSH
3.0000 mL | Freq: Two times a day (BID) | INTRAVENOUS | Status: DC
Start: 2023-06-16 — End: 2023-06-16

## 2023-06-16 NOTE — H&P (Signed)
 Anna Hanson is an 58 y.o. female.   Chief Complaint: history of colon polyps and family history of colon cancer.  HPI: Anna Hanson is a 58 y.o. female with no PMH asthma, hyperlipidemia, who presents for history of colon polyps and family history of colon cancer.  The patient denies having any nausea, vomiting, fever, chills, hematochezia, melena, hematemesis, abdominal distention, abdominal pain, diarrhea, jaundice, pruritus or weight loss.  Father was diagnosed with colon cancer in his 34s.  Past Medical History:  Diagnosis Date   Asthma    BCC (basal cell carcinoma of skin)    Nose   Headache    Medical history non-contributory    Superficial basal cell carcinoma (BCC) 06/27/2008   Between Breasts   Superficial basal cell carcinoma (BCC) 06/27/2008   Left Mid Shin   Superficial basal cell carcinoma (BCC) 05/18/2016   Left Breast    Past Surgical History:  Procedure Laterality Date   CHOLECYSTECTOMY     COLONOSCOPY     COLONOSCOPY N/A 01/24/2013   Procedure: COLONOSCOPY;  Surgeon: Claudis RAYMOND Rivet, MD;  [facility];  Service: Endoscopy;  Laterality: N/A;  730   COLONOSCOPY N/A 05/10/2018   Procedure: COLONOSCOPY;  Surgeon: Rivet Claudis RAYMOND, MD;  [facility];  Service: Endoscopy;  Laterality: N/A;  830   POLYPECTOMY  05/10/2018   Procedure: POLYPECTOMY;  Surgeon: Rivet Claudis RAYMOND, MD;  [facility];  Service: Endoscopy;;  transverse    Family History  Problem Relation Age of Onset   Colon cancer Father    Social History:  reports that she has never smoked. She has never used smokeless tobacco. She reports that she does not drink alcohol and does not use drugs.  Allergies:  Allergies  Allergen Reactions   Other Anaphylaxis    Cashews and Pecans    Medications Prior to Admission  Medication Sig Dispense Refill   olmesartan (BENICAR) 20 MG tablet Take 20 mg by mouth daily.     omeprazole (PRILOSEC) 20 MG capsule Take 20 mg by  mouth daily before breakfast.     OVER THE COUNTER MEDICATION daily at 6 (six) AM. Vit D 3 once per day.     OVER THE COUNTER MEDICATION Vit C once per day.     zinc gluconate 50 MG tablet Take 50 mg by mouth daily.     albuterol (PROVENTIL HFA;VENTOLIN HFA) 108 (90 Base) MCG/ACT inhaler Inhale 1-2 puffs into the lungs every 6 (six) hours as needed for wheezing or shortness of breath. (Patient not taking: Reported on 05/18/2023)     ibuprofen (ADVIL,MOTRIN) 200 MG tablet Take 800 mg by mouth every 8 (eight) hours as needed (pain/headaches.). (Patient not taking: Reported on 05/18/2023)      Results for orders placed or performed during the hospital encounter of 06/16/23 (from the past 48 hours)  Pregnancy, urine POC     Status: None   Collection Time: 06/16/23  8:21 AM  Result Value Ref Range   Preg Test, Ur NEGATIVE NEGATIVE    Comment:        THE SENSITIVITY OF THIS METHODOLOGY IS >24 mIU/mL    No results found.  Review of Systems  All other systems reviewed and are negative.   Blood pressure (!) 125/91, pulse 88, temperature 98.6 F (37 C), temperature source Oral, resp. rate 17, height 5' 4.5 (1.638 m), weight 83.9 kg, last menstrual period 02/07/2023, SpO2 100%. Physical Exam  GENERAL: The patient  is AO x3, in no acute distress. HEENT: Head is normocephalic and atraumatic. EOMI are intact. Mouth is well hydrated and without lesions. NECK: Supple. No masses LUNGS: Clear to auscultation. No presence of rhonchi/wheezing/rales. Adequate chest expansion HEART: RRR, normal s1 and s2. ABDOMEN: Soft, nontender, no guarding, no peritoneal signs, and nondistended. BS +. No masses. EXTREMITIES: Without any cyanosis, clubbing, rash, lesions or edema. NEUROLOGIC: AOx3, no focal motor deficit. SKIN: no jaundice, no rashes  Assessment/Plan Anna Hanson is a 58 y.o. female with no PMH asthma, hyperlipidemia, who presents for history of colon polyps and family history of colon cancer.   Will proceed with colonoscopy.  Toribio Eartha Flavors, MD 06/16/2023, 9:01 AM

## 2023-06-16 NOTE — Anesthesia Preprocedure Evaluation (Addendum)
 Anesthesia Evaluation  Patient identified by MRN, date of birth, ID band Patient awake    Reviewed: Allergy  & Precautions, H&P , NPO status , Patient's Chart, lab work & pertinent test results, reviewed documented beta blocker date and time   Airway Mallampati: II  TM Distance: >3 FB Neck ROM: full    Dental no notable dental hx. (+) Dental Advisory Given, Teeth Intact   Pulmonary asthma    Pulmonary exam normal breath sounds clear to auscultation       Cardiovascular Exercise Tolerance: Good negative cardio ROS Normal cardiovascular exam Rhythm:regular Rate:Normal     Neuro/Psych  Headaches  negative psych ROS   GI/Hepatic negative GI ROS, Neg liver ROS,,,  Endo/Other  negative endocrine ROS    Renal/GU negative Renal ROS  negative genitourinary   Musculoskeletal   Abdominal   Peds  Hematology negative hematology ROS (+)   Anesthesia Other Findings   Reproductive/Obstetrics negative OB ROS                             Anesthesia Physical Anesthesia Plan  ASA: 2  Anesthesia Plan: General   Post-op Pain Management: Minimal or no pain anticipated   Induction: Intravenous  PONV Risk Score and Plan: Propofol  infusion  Airway Management Planned: Natural Airway and Nasal Cannula  Additional Equipment: None  Intra-op Plan:   Post-operative Plan:   Informed Consent: I have reviewed the patients History and Physical, chart, labs and discussed the procedure including the risks, benefits and alternatives for the proposed anesthesia with the patient or authorized representative who has indicated his/her understanding and acceptance.     Dental Advisory Given  Plan Discussed with: CRNA  Anesthesia Plan Comments:        Anesthesia Quick Evaluation

## 2023-06-16 NOTE — Discharge Instructions (Signed)
 You are being discharged to home.  Resume your previous diet.  We are waiting for your pathology results.  Your physician has recommended a repeat colonoscopy in three years for surveillance.

## 2023-06-16 NOTE — Anesthesia Postprocedure Evaluation (Signed)
 Anesthesia Post Note  Patient: Anna Hanson  Procedure(s) Performed: COLONOSCOPY WITH PROPOFOL  POLYPECTOMY SUBMUCOSAL LIFTING INJECTION HEMOSTASIS CLIP PLACEMENT  Patient location during evaluation: Short Stay Anesthesia Type: General Level of consciousness: awake and alert Pain management: pain level controlled Vital Signs Assessment: post-procedure vital signs reviewed and stable Respiratory status: spontaneous breathing Cardiovascular status: blood pressure returned to baseline and stable Postop Assessment: no apparent nausea or vomiting Anesthetic complications: no   No notable events documented.   Last Vitals:  Vitals:   06/16/23 0828 06/16/23 0954  BP: (!) 125/91 103/81  Pulse: 88 89  Resp: 17 15  Temp: 37 C   SpO2: 100% 99%    Last Pain:  Vitals:   06/16/23 0954  TempSrc:   PainSc: 0-No pain                 Belinda L Vincy Feliz

## 2023-06-16 NOTE — Addendum Note (Signed)
 Addendum  created 06/16/23 1052 by Juluis Ok, CRNA   Clinical Note Signed

## 2023-06-16 NOTE — Transfer of Care (Addendum)
 Immediate Anesthesia Transfer of Care Note  Patient: Anna Hanson  Procedure(s) Performed: COLONOSCOPY WITH PROPOFOL  POLYPECTOMY SUBMUCOSAL LIFTING INJECTION HEMOSTASIS CLIP PLACEMENT  Patient Location: Short Stay  Anesthesia Type:General  Level of Consciousness: awake  Airway & Oxygen Therapy: Patient Spontanous Breathing  Post-op Assessment: Report given to RN  Post vital signs: Reviewed and stable  Last Vitals:  Vitals Value Taken Time  BP 1038/1 06/16/23   0954  Temp 36.4 06/16/23   0955  Pulse 89 06/16/23   0954  Resp 15 06/16/23   0954  SpO2 99% 06/16/23   0954    Last Pain:  Vitals:   06/16/23 0911  TempSrc:   PainSc: 0-No pain         Complications: No notable events documented.

## 2023-06-16 NOTE — Op Note (Signed)
 [facility] Patient Name: Anna Hanson Procedure Date: 06/16/2023 8:58 AM MRN: [id number] Date of Birth: 1966-01-18 Attending MD: Toribio Fortune , , [id number] CSN: [id number] Age: 58 Admit Type: Outpatient Procedure:                Colonoscopy Indications:              Screening in patient at increased risk: Family                            history of 1st-degree relative with colorectal                            cancer before age 56 years, Surveillance: Personal                            history of adenomatous polyps on last colonoscopy >                            3 years ago Providers:                Toribio Fortune, Devere Lodge, Bascom Blush, Gordy Lonni Balm, Technician Referring MD:              Medicines:                Monitored Anesthesia Care Complications:            No immediate complications. Estimated Blood Loss:     Estimated blood loss: none. Procedure:                Pre-Anesthesia Assessment:                           - Prior to the procedure, a History and Physical                            was performed, and patient medications, allergies                            and sensitivities were reviewed. The patient's                            tolerance of previous anesthesia was reviewed.                           - The risks and benefits of the procedure and the                            sedation options and risks were discussed with the                            patient. All questions were answered and informed                            consent  was obtained.                           - ASA Grade Assessment: II - A patient with mild                            systemic disease.                           After obtaining informed consent, the colonoscope                            was passed under direct vision. Throughout the                            procedure, the patient's blood pressure, pulse, and                             oxygen saturations were monitored continuously. The                            [id number]-HQ190L ([id number]) scope was introduced through                            the anus and advanced to the the cecum, identified                            by appendiceal orifice and ileocecal valve. The                            colonoscopy was performed without difficulty. The                            patient tolerated the procedure well. The quality                            of the bowel preparation was excellent. Scope In: 9:15:13 AM Scope Out: 9:47:13 AM Scope Withdrawal Time: 0 hours 28 minutes 7 seconds  Total Procedure Duration: 0 hours 32 minutes 0 seconds  Findings:      The perianal and digital rectal examinations were normal.      A 12 to 13 mm polyp was found in the transverse colon. The polyp was       multi-lobulated and semi-sessile. Area was successfully injected with 2       mL Eleview for a lift polypectomy. Imaging was performed using white       light and narrow band imaging to visualize the mucosa and demarcate the       polyp site after injection for EMR purposes. The polyp was removed with       a cold snare. Resection and retrieval were complete. To prevent bleeding       after the polypectomy, two hemostatic clips were successfully placed.       Clip manufacturer: [employer]. There was no bleeding at the end       of the procedure.      A 2 mm  polyp was found in the transverse colon. The polyp was sessile.       The polyp was removed with a cold snare. Resection and retrieval were       complete.      A few small-mouthed diverticula were found in the sigmoid colon.      Non-bleeding internal hemorrhoids were found during retroflexion. The       hemorrhoids were small. Impression:               - One 12 to 13 mm polyp in the transverse colon,                            removed with a cold snare. Resected and retrieved.                            Injected. Clips were  placed. Clip manufacturer:                            [employer].                           - One 2 mm polyp in the transverse colon, removed                            with a cold snare. Resected and retrieved.                           - Diverticulosis in the sigmoid colon.                           - Non-bleeding internal hemorrhoids. Moderate Sedation:      Per Anesthesia Care Recommendation:           - Discharge patient to home (ambulatory).                           - Resume previous diet.                           - Await pathology results.                           - Repeat colonoscopy in 3 years for surveillance. Procedure Code(s):        --- Professional ---                           [phone], 59, Colonoscopy, flexible; with removal of                            tumor(s), polyp(s), or other lesion(s) by snare                            technique                           45381, Colonoscopy, flexible; with directed  submucosal injection(s), any substance Diagnosis Code(s):        --- Professional ---                           Z80.0, Family history of malignant neoplasm of                            digestive organs                           Z86.010, Personal history of colonic polyps                           D12.3, Benign neoplasm of transverse colon (hepatic                            flexure or splenic flexure)                           K64.8, Other hemorrhoids                           K57.30, Diverticulosis of large intestine without                            perforation or abscess without bleeding CPT copyright 2022 American Medical Association. All rights reserved. The codes documented in this report are preliminary and upon coder review may  be revised to meet current compliance requirements. Toribio Fortune, MD Toribio Fortune,  06/16/2023 9:54:17 AM This report has been signed electronically. Number of Addenda: 0

## 2023-06-19 ENCOUNTER — Encounter (HOSPITAL_COMMUNITY): Payer: Self-pay | Admitting: Gastroenterology

## 2023-06-19 ENCOUNTER — Encounter (INDEPENDENT_AMBULATORY_CARE_PROVIDER_SITE_OTHER): Payer: Self-pay | Admitting: *Deleted

## 2023-06-19 LAB — SURGICAL PATHOLOGY

## 2023-06-22 ENCOUNTER — Encounter (INDEPENDENT_AMBULATORY_CARE_PROVIDER_SITE_OTHER): Payer: Self-pay | Admitting: *Deleted

## 2023-06-23 NOTE — Progress Notes (Deleted)
 Name: Anna Hanson DOB: 1966/03/09 MRN: [id number]  History of Present Illness: [name] is a 58 y.o. female who presents today as a new patient at [facility]. All available relevant medical records have been reviewed.  ***She is accompanied by ***. - GU History: 1. ***.  She reports chief complaint of ***recurrent UTls.  Urine culture results in past 12 months: ***  Urinary Symptoms: She reports *** UTl's in the last year. When present, UTI symptoms include ***dysuria, ***increased urinary urgency, ***frequency, ***. She reports that UTI symptoms do ***not seem to correlate with intercourse. She {Actions; denies-reports:120008} acute UTI symptoms today.  At baseline: She {Actions; denies-reports:120008} urinary urgency, frequency, dysuria, gross hematuria, hesitancy, straining to void, or sensations of incomplete emptying. She reports voiding *** times per day and *** times at night. She {Actions; denies-reports:120008} pushing on a bulge in order to empty her bladder.  She {Actions; denies-reports:120008} urge incontinence. She {Actions; denies-reports:120008} stress incontinence with ***cough/***laugh/***sneeze/***heavy lifting/***exercise. She {Actions; denies-reports:120008} enuresis. She leaks *** times per ***. Wears *** ***pads / ***diapers per day. She states the ***SUI / ***UUI is predominant. This has been going on for {NUMBERS 1-12:18279} {days/wks/mos/yrs:310907} and {ACTION; IS/IS XBM:[id number]} significantly bothersome. In terms of treatment, She has tried ***.  She {Actions; denies-reports:120008} caffeine intake.  She {Actions; denies-reports:120008} history of pyelonephritis.  She {Actions; denies-reports:120008} history of kidney stones.  Vaginal / prolapse Symptoms: She {Actions; denies-reports:120008} vaginal bulge sensation.  She {Actions; denies-reports:120008} seeing a vaginal bulge. This bulge is bothersome and is the size of  ***. It was first noticed ***.  She {Actions; denies-reports:120008} vaginal pain, bleeding, or discharge.  She {Actions; denies-reports:120008} use of topical vaginal estrogen cream.  Bowel Symptoms: She has a continent bowel movement *** time(s) per ***. Typical Bristol stool scale of continent bowel episodes is Type ***. She {Actions; denies-reports:120008} Fl episodes. Fecal incontinence started *** and occurs *** times per week. Typical Bristol stool scale of incontinent bowel episodes is Type ***. She {Actions; denies-reports:120008} straining and {Actions; denies-reports:120008} splinting to defecate. She {Actions; denies-reports:120008} rectal bleeding. She {Actions; denies-reports:120008} feeling like she fully empties her rectum with defecation. She {Actions; denies-reports:120008} urgency with defecation. She {Actions; denies-reports:120008} difficulty wiping clean. She {Actions; denies-reports:120008} taking laxatives, stool softeners, or fiber supplements. Last colonoscopy was***.  Past OB/GYN History: OB History   No obstetric history on file.    She {Actions; denies-reports:120008} being sexually active.  She {Actions; denies-reports:120008} dyspareunia. ***Dyspareunia is located ***at the introitus / ***deep inside the vagina. She has *** child(ren) who were delivered ***vaginally ***via c-section. She {Actions; denies-reports:120008} significant tearing with that ***delivery / those ***deliveries. She is {DESC; PRE/POST:32304} menopausal.  Last pap smear was ***. She {Actions; denies-reports:120008} history of *** hysterectomy.  Fall Screening: Do you usually have a device to assist in your mobility? {yes/no:20286} ***cane / ***walker / ***wheelchair  Medications: Current Outpatient Medications  Medication Sig Dispense Refill   albuterol (PROVENTIL HFA;VENTOLIN HFA) 108 (90 Base) MCG/ACT inhaler Inhale 1-2 puffs into the lungs every 6 (six) hours as needed for wheezing or  shortness of breath. (Patient not taking: Reported on 05/18/2023)     ibuprofen (ADVIL,MOTRIN) 200 MG tablet Take 800 mg by mouth every 8 (eight) hours as needed (pain/headaches.). (Patient not taking: Reported on 05/18/2023)     olmesartan (BENICAR) 20 MG tablet Take 20 mg by mouth daily.     omeprazole (PRILOSEC) 20 MG capsule Take 20 mg by mouth daily before breakfast.  OVER THE COUNTER MEDICATION daily at 6 (six) AM. Vit D 3 once per day.     OVER THE COUNTER MEDICATION Vit C once per day.     zinc gluconate 50 MG tablet Take 50 mg by mouth daily.     No current facility-administered medications for this visit.    Allergies: Allergies  Allergen Reactions   Other Anaphylaxis    Cashews and Pecans    Past Medical History:  Diagnosis Date   Asthma    BCC (basal cell carcinoma of skin)    Nose   Headache    Medical history non-contributory    Superficial basal cell carcinoma (BCC) 06/27/2008   Between Breasts   Superficial basal cell carcinoma (BCC) 06/27/2008   Left Mid Shin   Superficial basal cell carcinoma (BCC) 05/18/2016   Left Breast   Past Surgical History:  Procedure Laterality Date   CHOLECYSTECTOMY     COLONOSCOPY     COLONOSCOPY N/A 01/24/2013   Procedure: COLONOSCOPY;  Surgeon: Malissa Hippo, MD;  [facility];  Service: Endoscopy;  Laterality: N/A;  730   COLONOSCOPY N/A 05/10/2018   Procedure: COLONOSCOPY;  Surgeon: Malissa Hippo, MD;  [facility];  Service: Endoscopy;  Laterality: N/A;  830   COLONOSCOPY WITH PROPOFOL N/A 06/16/2023   Procedure: COLONOSCOPY WITH PROPOFOL;  Surgeon: Dolores Frame, MD;  [facility];  Service: Gastroenterology;  Laterality: N/A;  10:00AM;ASA 1   HEMOSTASIS CLIP PLACEMENT  06/16/2023   Procedure: HEMOSTASIS CLIP PLACEMENT;  Surgeon: Dolores Frame, MD;  [facility];  Service: Gastroenterology;;   POLYPECTOMY  05/10/2018   Procedure: POLYPECTOMY;  Surgeon: Malissa Hippo, MD;  [facility];  Service: Endoscopy;;  transverse   POLYPECTOMY  06/16/2023   Procedure: POLYPECTOMY;  Surgeon: Dolores Frame, MD;  [facility];  Service: Gastroenterology;;   Brooke Dare INJECTION  06/16/2023   Procedure: SUBMUCOSAL LIFTING INJECTION;  Surgeon: Dolores Frame, MD;  [facility];  Service: Gastroenterology;;   Family History  Problem Relation Age of Onset   Colon cancer Father    Social History   Socioeconomic History   Marital status: Married    Spouse name: Not on file   Number of children: Not on file   Years of education: Not on file   Highest education level: Not on file  Occupational History   Not on file  Tobacco Use   Smoking status: Never   Smokeless tobacco: Never  Vaping Use   Vaping status: Never Used  Substance and Sexual Activity   Alcohol use: No   Drug use: No   Sexual activity: Not on file  Other Topics Concern   Not on file  Social History Narrative   Not on file   Social Drivers of Health   Financial Resource Strain: Not on file  Food Insecurity: Not on file  Transportation Needs: Not on file  Physical Activity: Not on file  Stress: Not on file  Social Connections: Not on file  Intimate Partner Violence: Not on file    SUBJECTIVE  Review of Systems Constitutional: Patient denies any unintentional weight loss or change in strength lntegumentary: Patient denies any rashes or pruritus Cardiovascular: Patient denies chest pain or syncope Respiratory: Patient denies shortness of breath Gastrointestinal: Patient ***denies nausea, vomiting, constipation, or diarrhea Musculoskeletal: Patient denies muscle cramps or weakness Neurologic: Patient denies convulsions or seizures Allergic/Immunologic: Patient denies recent  allergic reaction(s) Hematologic/Lymphatic: Patient denies bleeding tendencies Endocrine: Patient denies heat/cold intolerance  GU: As per  HPI.  OBJECTIVE There were no vitals filed for this visit. There is no height or weight on file to calculate BMI.  Physical Examination Constitutional: No obvious distress; patient is non-toxic appearing  Cardiovascular: No visible lower extremity edema.  Respiratory: The patient does not have audible wheezing/stridor; respirations do not appear labored  Gastrointestinal: Abdomen non-distended Musculoskeletal: Normal ROM of UEs  Skin: No obvious rashes/open sores  Neurologic: CN 2-12 grossly intact Psychiatric: Answered questions appropriately with normal affect  Hematologic/Lymphatic/Immunologic: No obvious bruises or sites of spontaneous bleeding  UA:  ***positive for *** leukocytes, *** blood, ***nitrites ***Urine microscopy:  ***negative  *** WBC/hpf, *** RBC/hpf, *** bacteria ***with no evidence of UTI ***with no evidence of microscopic hematuria ***otherwise unremarkable ***glucosuria (secondary to ***Jardiance ***Farxiga use)  PVR: *** ml  ASSESSMENT No diagnosis found.  ***Recurrent UTls / ***History of UTls with insufficient urine culture data to formally validate recurrent UTI diagnosis:  ***  We agreed to plan for follow up in *** months or sooner if needed.  Patient verbalized understanding of and agreement with current plan. All questions were answered.  PLAN Advised the following: ***. Start topical vaginal estrogen cream as prescribed. ***. Maintain adequate fluid intake daily to flush out the urinary tract. ***. Urinate every 4-6 hours while awake to minimize urinary stasis / bacterial overgrowth in the bladder. ***. Consider OTC supplements for UTI prevention. ***. ***No follow-ups on file.  No orders of the defined types were placed in this encounter.   It has been explained that the patient is to follow regularly with their PCP in addition to all other providers involved in their care and to follow instructions provided by these respective offices.  Patient advised to contact urology clinic if any urologic-pertaining questions, concerns, new symptoms or problems arise in the interim period.  There are no Patient Instructions on file for this visit.  Electronically signed by:  Donnita Falls, MSN, FNP-C, CUNP 06/23/2023 3:15 PM

## 2023-06-28 ENCOUNTER — Ambulatory Visit: Payer: BLUE CROSS/BLUE SHIELD | Admitting: Urology

## 2023-06-30 DIAGNOSIS — Z0001 Encounter for general adult medical examination with abnormal findings: Secondary | ICD-10-CM | POA: Diagnosis not present

## 2023-06-30 DIAGNOSIS — I10 Essential (primary) hypertension: Secondary | ICD-10-CM | POA: Diagnosis not present

## 2023-06-30 DIAGNOSIS — E782 Mixed hyperlipidemia: Secondary | ICD-10-CM | POA: Diagnosis not present

## 2023-06-30 DIAGNOSIS — E7849 Other hyperlipidemia: Secondary | ICD-10-CM | POA: Diagnosis not present

## 2023-06-30 DIAGNOSIS — K760 Fatty (change of) liver, not elsewhere classified: Secondary | ICD-10-CM | POA: Diagnosis not present

## 2023-06-30 DIAGNOSIS — Z1321 Encounter for screening for nutritional disorder: Secondary | ICD-10-CM | POA: Diagnosis not present

## 2023-07-07 DIAGNOSIS — E7849 Other hyperlipidemia: Secondary | ICD-10-CM | POA: Diagnosis not present

## 2023-07-07 DIAGNOSIS — I10 Essential (primary) hypertension: Secondary | ICD-10-CM | POA: Diagnosis not present

## 2023-07-07 DIAGNOSIS — K760 Fatty (change of) liver, not elsewhere classified: Secondary | ICD-10-CM | POA: Diagnosis not present

## 2023-07-07 DIAGNOSIS — K2100 Gastro-esophageal reflux disease with esophagitis, without bleeding: Secondary | ICD-10-CM | POA: Diagnosis not present

## 2023-07-07 DIAGNOSIS — E782 Mixed hyperlipidemia: Secondary | ICD-10-CM | POA: Diagnosis not present

## 2023-07-07 DIAGNOSIS — Z0001 Encounter for general adult medical examination with abnormal findings: Secondary | ICD-10-CM | POA: Diagnosis not present

## 2023-07-07 DIAGNOSIS — Z6830 Body mass index (BMI) 30.0-30.9, adult: Secondary | ICD-10-CM | POA: Diagnosis not present

## 2023-07-14 ENCOUNTER — Encounter: Payer: Self-pay | Admitting: Urology

## 2023-07-14 ENCOUNTER — Ambulatory Visit: Payer: BLUE CROSS/BLUE SHIELD | Admitting: Urology

## 2023-07-14 DIAGNOSIS — N3946 Mixed incontinence: Secondary | ICD-10-CM

## 2023-07-14 DIAGNOSIS — R351 Nocturia: Secondary | ICD-10-CM

## 2023-07-14 DIAGNOSIS — N3281 Overactive bladder: Secondary | ICD-10-CM

## 2023-07-14 DIAGNOSIS — Z789 Other specified health status: Secondary | ICD-10-CM

## 2023-07-14 DIAGNOSIS — N393 Stress incontinence (female) (male): Secondary | ICD-10-CM

## 2023-07-14 DIAGNOSIS — N3941 Urge incontinence: Secondary | ICD-10-CM

## 2023-07-14 LAB — URINALYSIS, ROUTINE W REFLEX MICROSCOPIC
Bilirubin, UA: NEGATIVE
Glucose, UA: NEGATIVE
Ketones, UA: NEGATIVE
Leukocytes,UA: NEGATIVE
Nitrite, UA: NEGATIVE
Protein,UA: NEGATIVE
RBC, UA: NEGATIVE
Specific Gravity, UA: <1.005 — ABNORMAL LOW (ref 1.005–1.030)
Urobilinogen, Ur: 0.2 mg/dL (ref 0.2–1.0)
pH, UA: 6 (ref 5.0–7.5)

## 2023-07-14 NOTE — Patient Instructions (Signed)
 Marland Kitchen

## 2023-07-14 NOTE — Progress Notes (Signed)
 Name: Anna Hanson DOB: 01-07-1966 MRN: [id number]  History of Present Illness: Ms. Anna Hanson is a 58 y.o. female who presents today as a new patient at [facility] . All available relevant medical records have been reviewed.  Today: She reports chief complaint of urinary incontinence. Reports nocturia x3, urgency, and urge incontinence. Denies bothersome daytime urinary frequency.  She reports stress incontinence with cough/laugh/sneeze. She reports the SUI is predominant. She leaks several times per day. Wears 2-3 pads per day. She reports urinary incontinence is significantly bothersome.   She denies prior attempted treatment for these symptoms.  She reports significant caffeine intake (5 caffeinated beverages per day on average). She does not take a daily diuretic use.  She denies history of obstructive sleep apnea; denies ever having a sleep study before. She tries to minimize fluid intake within 3 hours prior to bedtime and overnight.  She reports caffeine intake within 8 hours prior to bedtime.  She denies dysuria, gross hematuria, straining to void, or sensations of incomplete emptying.  Reports 2 UTIs with positive urine cultures in the past 12 months; records unavailable in chart for review today.  Denies vaginal bulge sensation.  Medications: Current Outpatient Medications  Medication Sig Dispense Refill   albuterol (PROVENTIL HFA;VENTOLIN HFA) 108 (90 Base) MCG/ACT inhaler Inhale 1-2 puffs into the lungs every 6 (six) hours as needed for wheezing or shortness of breath. (Patient not taking: Reported on 05/18/2023)     olmesartan (BENICAR) 20 MG tablet Take 20 mg by mouth daily.     omeprazole (PRILOSEC) 20 MG capsule Take 20 mg by mouth daily before breakfast.     OVER THE COUNTER MEDICATION Vit C once per day.     zinc gluconate 50 MG tablet Take 50 mg by mouth daily.     No current facility-administered medications for this visit.     Allergies: Allergies  Allergen Reactions   Other Anaphylaxis    Cashews and Pecans    Past Medical History:  Diagnosis Date   Asthma    BCC (basal cell carcinoma of skin)    Nose   Headache    Medical history non-contributory    Superficial basal cell carcinoma (BCC) 06/27/2008   Between Breasts   Superficial basal cell carcinoma (BCC) 06/27/2008   Left Mid Shin   Superficial basal cell carcinoma (BCC) 05/18/2016   Left Breast   Past Surgical History:  Procedure Laterality Date   CHOLECYSTECTOMY     COLONOSCOPY     COLONOSCOPY N/A 01/24/2013   Procedure: COLONOSCOPY;  Surgeon: Malissa Hippo, MD;  [facility];  Service: Endoscopy;  Laterality: N/A;  730   COLONOSCOPY N/A 05/10/2018   Procedure: COLONOSCOPY;  Surgeon: Malissa Hippo, MD;  [facility];  Service: Endoscopy;  Laterality: N/A;  830   COLONOSCOPY WITH PROPOFOL N/A 06/16/2023   Procedure: COLONOSCOPY WITH PROPOFOL;  Surgeon: Dolores Frame, MD;  [facility];  Service: Gastroenterology;  Laterality: N/A;  10:00AM;ASA 1   HEMOSTASIS CLIP PLACEMENT  06/16/2023   Procedure: HEMOSTASIS CLIP PLACEMENT;  Surgeon: Dolores Frame, MD;  [facility];  Service: Gastroenterology;;   POLYPECTOMY  05/10/2018   Procedure: POLYPECTOMY;  Surgeon: Malissa Hippo, MD;  [facility];  Service: Endoscopy;;  transverse   POLYPECTOMY  06/16/2023   Procedure: POLYPECTOMY;  Surgeon: Dolores Frame, MD;  [facility];  Service: Gastroenterology;;   Brooke Dare INJECTION  06/16/2023  Procedure: SUBMUCOSAL LIFTING INJECTION;  Surgeon: Marguerita Merles, Reuel Boom, MD;  [facility];  Service: Gastroenterology;;   Family History  Problem Relation Age of Onset   Colon cancer Father    Social History   Socioeconomic History   Marital status: Married    Spouse name: Not on file   Number of children: Not on file   Years of  education: Not on file   Highest education level: Not on file  Occupational History   Not on file  Tobacco Use   Smoking status: Never   Smokeless tobacco: Never  Vaping Use   Vaping status: Never Used  Substance and Sexual Activity   Alcohol use: No   Drug use: No   Sexual activity: Not on file  Other Topics Concern   Not on file  Social History Narrative   Not on file   Social Drivers of Health   Financial Resource Strain: Not on file  Food Insecurity: Not on file  Transportation Needs: Not on file  Physical Activity: Not on file  Stress: Not on file  Social Connections: Not on file  Intimate Partner Violence: Not on file    SUBJECTIVE  Review of Systems Constitutional: Patient denies any unintentional weight loss or change in strength lntegumentary: Patient denies any rashes or pruritus Cardiovascular: Patient denies chest pain or syncope Respiratory: Patient denies shortness of breath Gastrointestinal: Patient denies constipation or diarrhea Musculoskeletal: Patient denies muscle cramps or weakness Neurologic: Patient denies convulsions or seizures Allergic/Immunologic: Patient denies recent allergic reaction(s) Hematologic/Lymphatic: Patient denies bleeding tendencies Endocrine: Patient denies heat/cold intolerance  GU: As per HPI.  OBJECTIVE There were no vitals filed for this visit. There is no height or weight on file to calculate BMI.  Physical Examination Constitutional: No obvious distress; patient is non-toxic appearing  Cardiovascular: No visible lower extremity edema.  Respiratory: The patient does not have audible wheezing/stridor; respirations do not appear labored  Gastrointestinal: Abdomen non-distended Musculoskeletal: Normal ROM of UEs  Skin: No obvious rashes/open sores  Neurologic: CN 2-12 grossly intact Psychiatric: Answered questions appropriately with normal affect  Hematologic/Lymphatic/Immunologic: No obvious bruises or sites of  spontaneous bleeding  UA: negative  PVR: 0 ml  ASSESSMENT Mixed stress and urge urinary incontinence - Plan: Urinalysis, Routine w reflex microscopic, BLADDER SCAN AMB NON-IMAGING  Stress incontinence of urine - Plan: Ambulatory referral to Urology  Urge incontinence of urine  Caffeine use  OAB (overactive bladder)  Nocturia  We discussed the different forms of urinary incontinence, such as stress and urge incontinence, and described how symptoms are consistent with mixed urinary incontinence.  1. For treatment of stress urinary incontinence: The etiology of this condition was explained in detail to include pelvic floor muscle relaxation and detachment of the urethra away from its connection to the pubic bone. Her risk profile was reviewed, including childbirth.  The management options were reviewed to include: No intervention, observation. Non-surgical options: Pelvic floor muscle rehabilitation Incontinence pessary Surgical consultation   She elected to proceed with surgical consultation at [facility].  2. For treatment of OAB with urinary frequency, nocturia, urgency, and urge incontinence: We discussed the symptoms of overactive bladder (OAB), which include urinary urgency, frequency, nocturia, with or without urge incontinence.   While we may not know the exact etiology of OAB, several risk factors can be identified.  - Likely exacerbated by caffeine intake.   We discussed the following management options in detail including potential benefits, risks, and side effects: Behavioral therapy:  Modify fluid intake Decreasing bladder irritants (such as caffeine) Urge suppression strategies Bladder retraining / timed voiding Double voiding Medication(s)  She decided to work on minimizing caffeine intake.  We agreed to follow up on an as-needed basis. Pt verbalized understanding and agreement. All questions were answered.   PLAN Advised the following: 1.  Minimize caffeine intake. 2. Referred for stress urinary incontinence surgical consultation at [facility]. 3. Return if symptoms worsen or fail to improve.  Orders Placed This Encounter  Procedures   Urinalysis, Routine w reflex microscopic   Ambulatory referral to Urology    Referral Priority:   Routine    Referral Type:   Consultation    Referral Reason:   Specialty Services Required    Requested Specialty:   Urology    Number of Visits Requested:   1   BLADDER SCAN AMB NON-IMAGING    It has been explained that the patient is to follow regularly with their PCP in addition to all other providers involved in their care and to follow instructions provided by these respective offices. Patient advised to contact urology clinic if any urologic-pertaining questions, concerns, new symptoms or problems arise in the interim period.  Patient Instructions                     Electronically signed by:  Donnita Falls, MSN, FNP-C, CUNP 07/14/2023 4:19 PM

## 2023-07-17 ENCOUNTER — Telehealth: Payer: Self-pay | Admitting: Urology

## 2023-07-17 NOTE — Telephone Encounter (Signed)
 Patient states mychart urinalysis shows abnormal and would like a call

## 2023-07-17 NOTE — Telephone Encounter (Signed)
 FYI and advise if needed

## 2023-07-17 NOTE — Telephone Encounter (Signed)
 Tried to return phone call to Pt no DPR left mychart message

## 2023-08-08 DIAGNOSIS — Z2821 Immunization not carried out because of patient refusal: Secondary | ICD-10-CM | POA: Diagnosis not present

## 2023-08-08 DIAGNOSIS — E66811 Obesity, class 1: Secondary | ICD-10-CM | POA: Diagnosis not present

## 2023-08-08 DIAGNOSIS — Z719 Counseling, unspecified: Secondary | ICD-10-CM | POA: Diagnosis not present

## 2023-08-08 DIAGNOSIS — Z789 Other specified health status: Secondary | ICD-10-CM | POA: Diagnosis not present

## 2023-08-11 DIAGNOSIS — N3946 Mixed incontinence: Secondary | ICD-10-CM | POA: Diagnosis not present

## 2023-08-11 DIAGNOSIS — R350 Frequency of micturition: Secondary | ICD-10-CM | POA: Diagnosis not present

## 2023-08-11 DIAGNOSIS — R351 Nocturia: Secondary | ICD-10-CM | POA: Diagnosis not present

## 2023-09-05 DIAGNOSIS — Z01419 Encounter for gynecological examination (general) (routine) without abnormal findings: Secondary | ICD-10-CM | POA: Diagnosis not present

## 2023-09-05 DIAGNOSIS — Z6830 Body mass index (BMI) 30.0-30.9, adult: Secondary | ICD-10-CM | POA: Diagnosis not present

## 2023-09-05 DIAGNOSIS — Z1151 Encounter for screening for human papillomavirus (HPV): Secondary | ICD-10-CM | POA: Diagnosis not present

## 2023-09-08 DIAGNOSIS — Z1231 Encounter for screening mammogram for malignant neoplasm of breast: Secondary | ICD-10-CM | POA: Diagnosis not present

## 2023-09-22 DIAGNOSIS — N3946 Mixed incontinence: Secondary | ICD-10-CM | POA: Diagnosis not present

## 2024-02-21 ENCOUNTER — Encounter (INDEPENDENT_AMBULATORY_CARE_PROVIDER_SITE_OTHER): Payer: Self-pay | Admitting: Gastroenterology
